# Patient Record
Sex: Female | Born: 1935 | Race: White | Smoking: Never smoker
Health system: Southern US, Community
[De-identification: ages and names within clinical notes are randomized; demographics above are authoritative.]

## PROBLEM LIST (undated history)

## (undated) DIAGNOSIS — H9190 Unspecified hearing loss, unspecified ear: Secondary | ICD-10-CM

## (undated) DIAGNOSIS — N183 Chronic kidney disease, stage 3 unspecified: Secondary | ICD-10-CM

## (undated) DIAGNOSIS — M5030 Other cervical disc degeneration, unspecified cervical region: Secondary | ICD-10-CM

## (undated) DIAGNOSIS — K219 Gastro-esophageal reflux disease without esophagitis: Secondary | ICD-10-CM

## (undated) DIAGNOSIS — R42 Dizziness and giddiness: Secondary | ICD-10-CM

## (undated) DIAGNOSIS — K912 Postsurgical malabsorption, not elsewhere classified: Secondary | ICD-10-CM

## (undated) DIAGNOSIS — R413 Other amnesia: Secondary | ICD-10-CM

## (undated) DIAGNOSIS — K90829 Short bowel syndrome, unspecified: Secondary | ICD-10-CM

## (undated) DIAGNOSIS — E039 Hypothyroidism, unspecified: Secondary | ICD-10-CM

## (undated) DIAGNOSIS — F32A Depression, unspecified: Secondary | ICD-10-CM

## (undated) DIAGNOSIS — H04123 Dry eye syndrome of bilateral lacrimal glands: Secondary | ICD-10-CM

## (undated) DIAGNOSIS — N281 Cyst of kidney, acquired: Secondary | ICD-10-CM

## (undated) DIAGNOSIS — F329 Major depressive disorder, single episode, unspecified: Secondary | ICD-10-CM

## (undated) DIAGNOSIS — M1990 Unspecified osteoarthritis, unspecified site: Secondary | ICD-10-CM

## (undated) DIAGNOSIS — F419 Anxiety disorder, unspecified: Secondary | ICD-10-CM

## (undated) DIAGNOSIS — S42309A Unspecified fracture of shaft of humerus, unspecified arm, initial encounter for closed fracture: Secondary | ICD-10-CM

## (undated) DIAGNOSIS — F0390 Unspecified dementia without behavioral disturbance: Secondary | ICD-10-CM

## (undated) DIAGNOSIS — Z9889 Other specified postprocedural states: Secondary | ICD-10-CM

## (undated) DIAGNOSIS — D6859 Other primary thrombophilia: Secondary | ICD-10-CM

## (undated) DIAGNOSIS — K5190 Ulcerative colitis, unspecified, without complications: Secondary | ICD-10-CM

## (undated) HISTORY — DX: Major depressive disorder, single episode, unspecified: F32.9

## (undated) HISTORY — DX: Unspecified dementia, unspecified severity, without behavioral disturbance, psychotic disturbance, mood disturbance, and anxiety: F03.90

## (undated) HISTORY — DX: Other primary thrombophilia: D68.59

## (undated) HISTORY — DX: Ulcerative colitis, unspecified, without complications: K51.90

## (undated) HISTORY — DX: Depression, unspecified: F32.A

## (undated) HISTORY — DX: Dry eye syndrome of bilateral lacrimal glands: H04.123

## (undated) HISTORY — DX: Other amnesia: R41.3

## (undated) HISTORY — DX: Postsurgical malabsorption, not elsewhere classified: K91.2

## (undated) HISTORY — PX: VAGINAL HYSTERECTOMY: SHX2639

## (undated) HISTORY — PX: APPENDECTOMY: SHX54

## (undated) HISTORY — DX: Other specified postprocedural states: Z98.890

## (undated) HISTORY — DX: Other cervical disc degeneration, unspecified cervical region: M50.30

## (undated) HISTORY — DX: Chronic kidney disease, stage 3 (moderate): N18.3

## (undated) HISTORY — DX: Short bowel syndrome, unspecified: K90.829

## (undated) HISTORY — DX: Gastro-esophageal reflux disease without esophagitis: K21.9

## (undated) HISTORY — DX: Cyst of kidney, acquired: N28.1

## (undated) HISTORY — DX: Anxiety disorder, unspecified: F41.9

## (undated) HISTORY — PX: OTHER SURGICAL HISTORY: SHX169

## (undated) HISTORY — DX: Dizziness and giddiness: R42

## (undated) HISTORY — DX: Hypothyroidism, unspecified: E03.9

## (undated) HISTORY — PX: CATARACT EXTRACTION: SUR2

## (undated) HISTORY — PX: ILEOSTOMY: SHX1783

## (undated) HISTORY — PX: COCHLEAR IMPLANT: SUR684

## (undated) HISTORY — DX: Unspecified fracture of shaft of humerus, unspecified arm, initial encounter for closed fracture: S42.309A

## (undated) HISTORY — DX: Chronic kidney disease, stage 3 unspecified: N18.30

## (undated) HISTORY — DX: Unspecified osteoarthritis, unspecified site: M19.90

## (undated) HISTORY — DX: Unspecified hearing loss, unspecified ear: H91.90

---

## 2010-12-11 ENCOUNTER — Other Ambulatory Visit: Payer: Self-pay | Admitting: Gastroenterology

## 2010-12-11 DIAGNOSIS — R109 Unspecified abdominal pain: Secondary | ICD-10-CM

## 2010-12-15 ENCOUNTER — Ambulatory Visit
Admission: RE | Admit: 2010-12-15 | Discharge: 2010-12-15 | Disposition: A | Discharge status: Home / Self Care | Payer: Medicare Other | Source: Ambulatory Visit | Attending: Gastroenterology | Admitting: Gastroenterology

## 2010-12-15 DIAGNOSIS — R109 Unspecified abdominal pain: Secondary | ICD-10-CM

## 2012-04-20 IMAGING — US US ABDOMEN COMPLETE
1 series · 14 of 25 positions shown · non-contrast
Comparison: None.

CLINICAL DATA: 3 months of pain.  Gallstones present on outside
CT.

COMPLETE ABDOMINAL ULTRASOUND

[Series 1: us abdomen complete · 0.19mm/px · 14 of 65 slices shown]
[im 1/65]
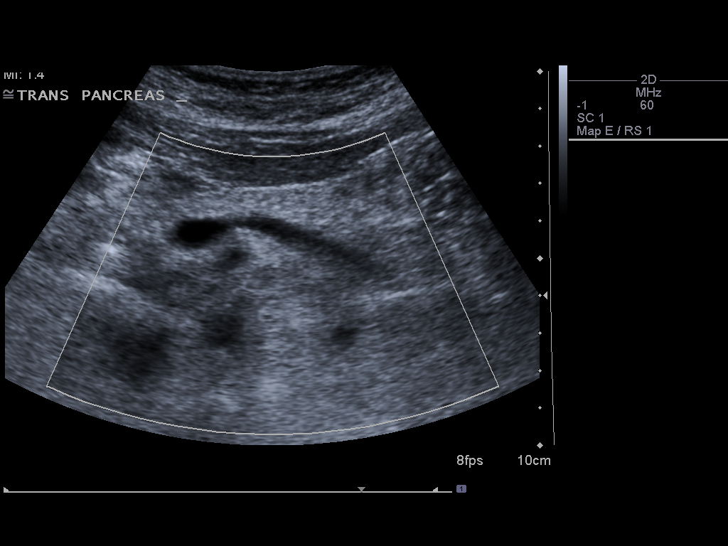
[im 6/65]
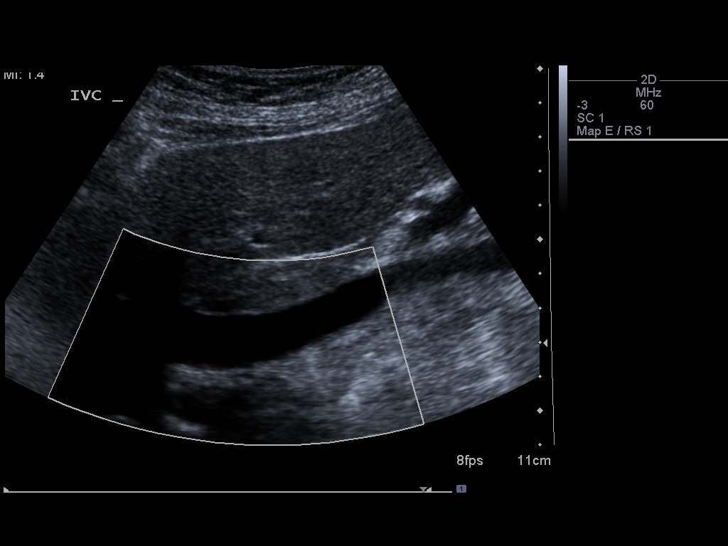
[im 11/65]
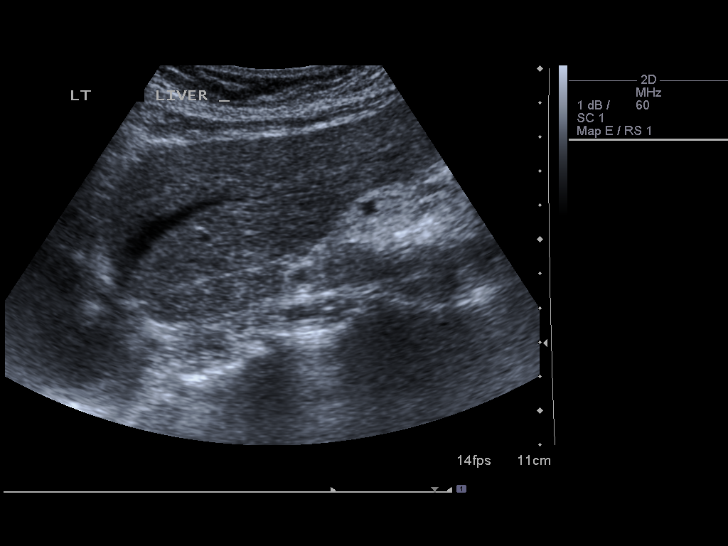
[im 17/65]
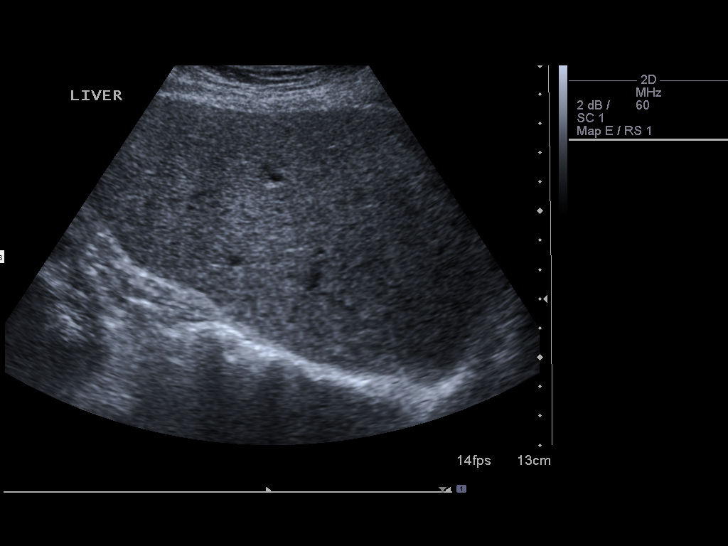
[im 22/65]
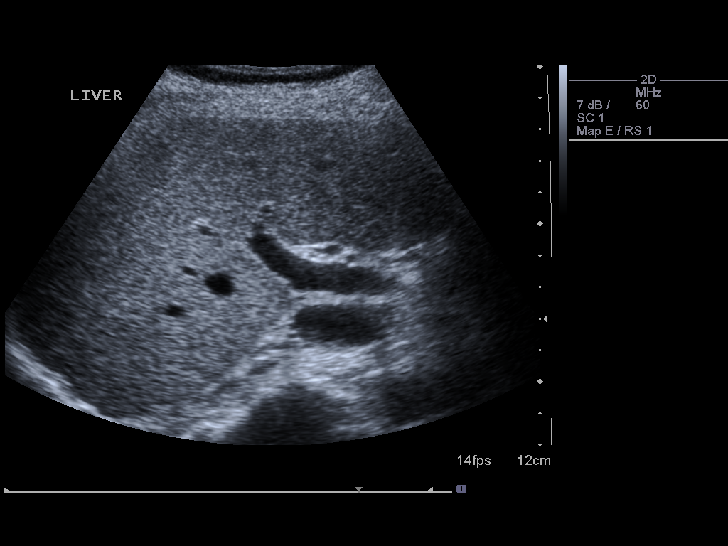
[im 25/65]
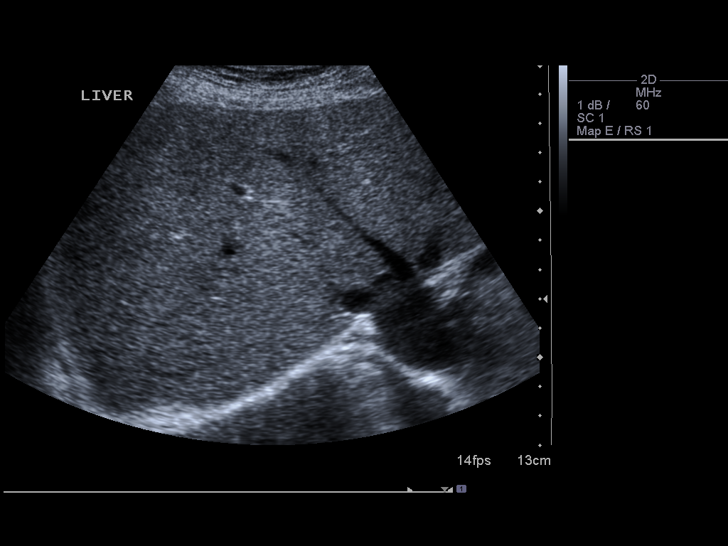
[im 30/65]
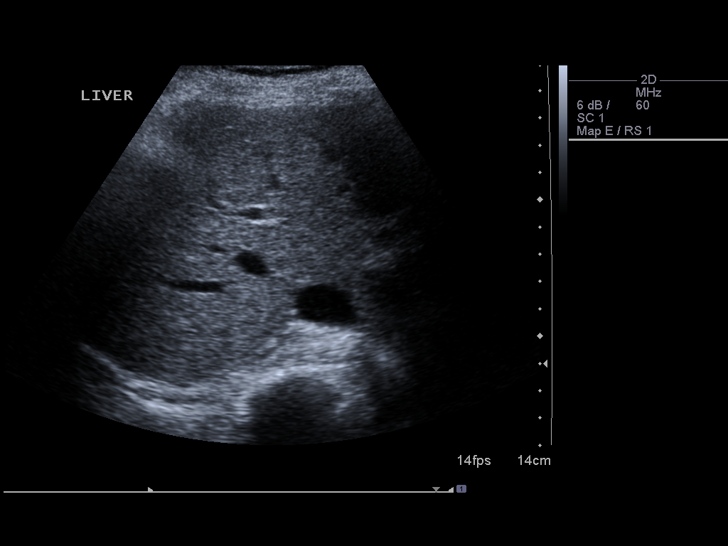
[im 35/65]
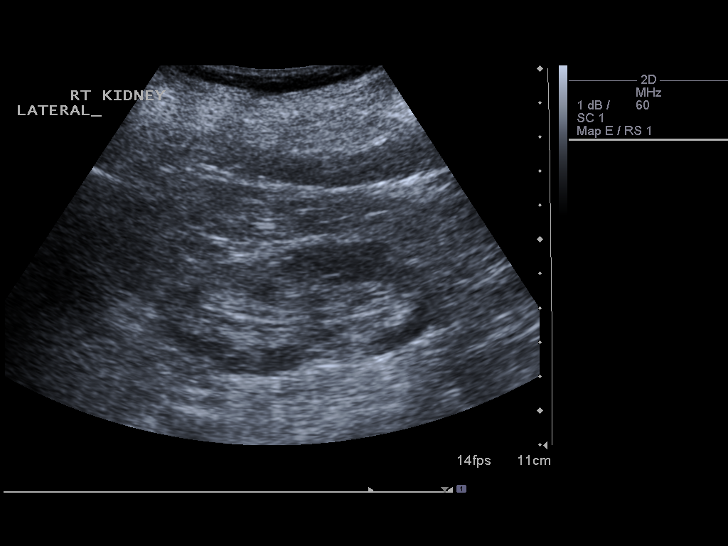
[im 41/65]
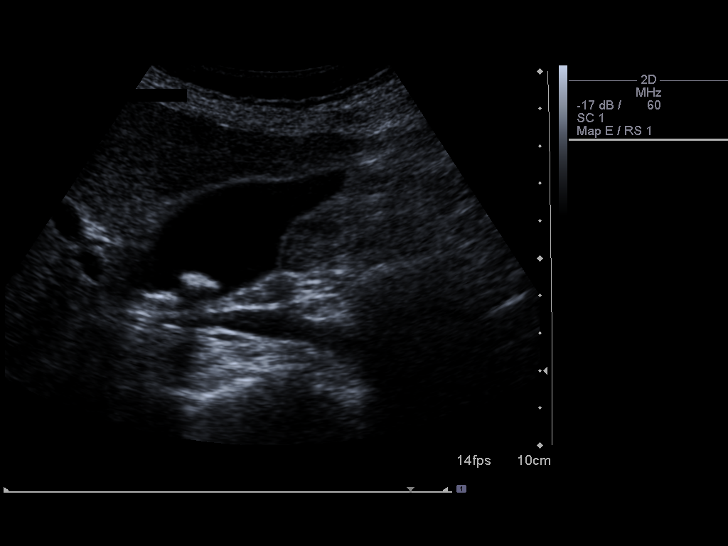
[im 43/65]
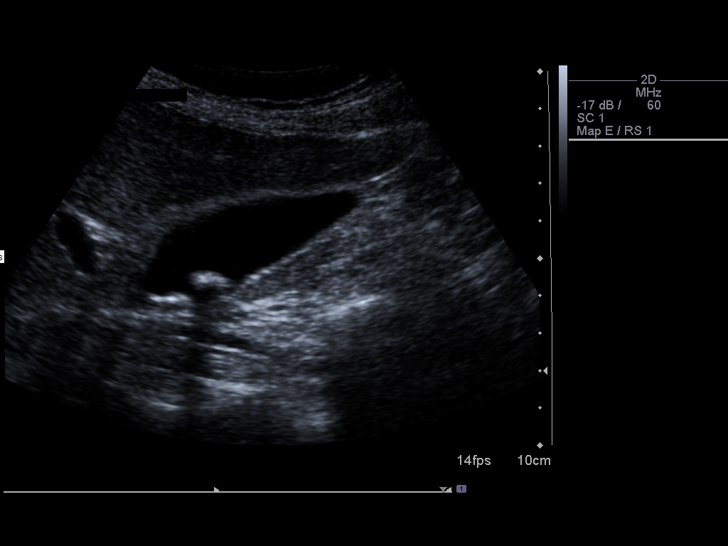
[im 49/65]
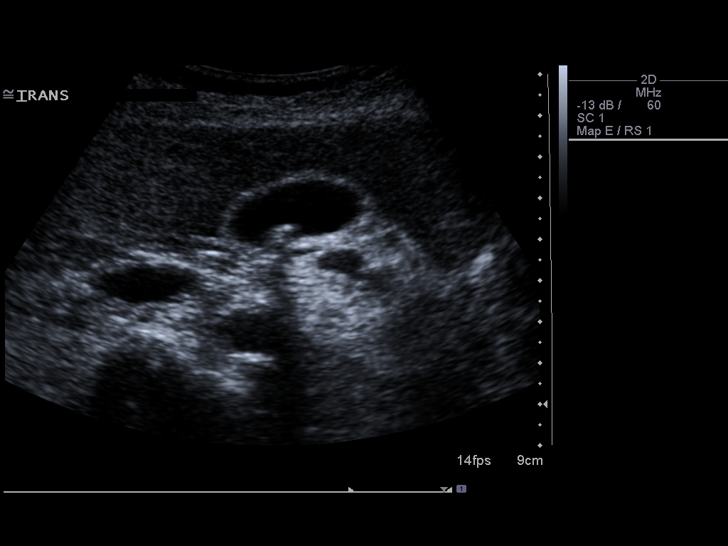
[im 54/65]
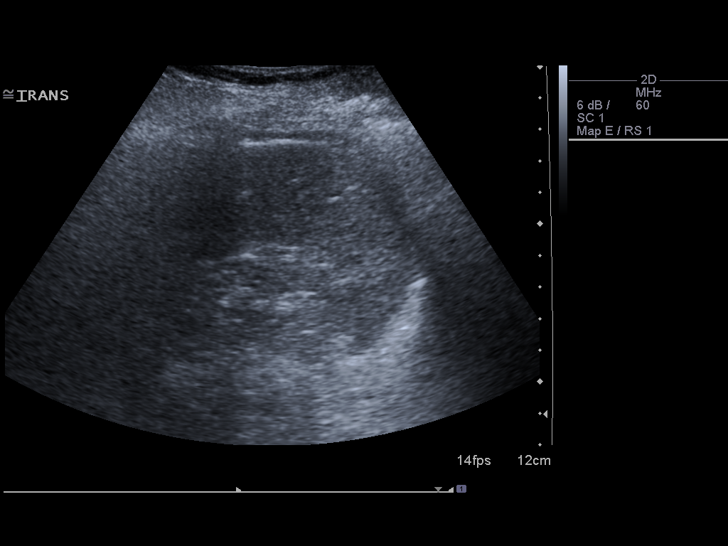
[im 59/65]
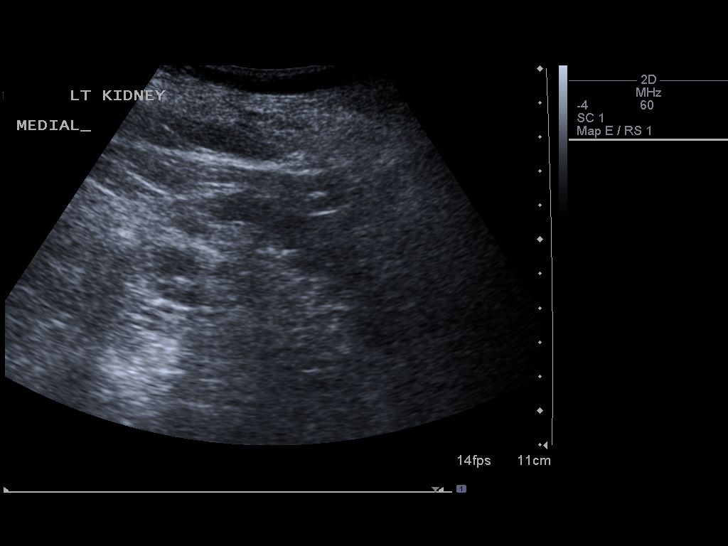
[im 65/65]
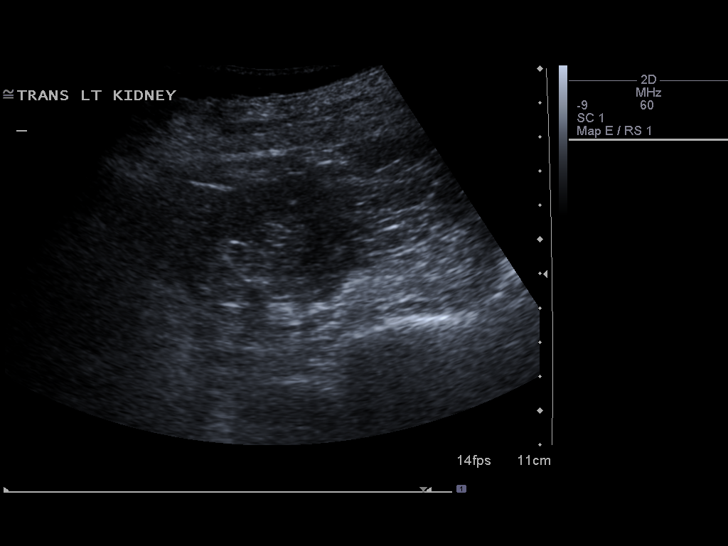

[14 of 25 positions shown; findings below may reference images not displayed]

FINDINGS: Gallbladder:  Mobile gallstones measure up to 1.2 cm.  No evidence
of gallbladder wall thickening or pericholecystic fluid.  The
patient was not tender over this region during scanning.

Common bile duct:  2.7 mm.

Liver:  Very mild increased echogenicity suggestive of mild fatty
infiltration without focal lesion noted.

IVC:  Appears normal.

Pancreas:  Slightly limited evaluation of the pancreatic tail
secondary to overlying bowel gas.  Otherwise no masses or
abnormality noted.

Spleen:  7.4 cm.  No focal mass.

Right Kidney:  8.7 cm in length. No hydronephrosis or renal mass.

Left Kidney:  9.9 cm in length.  No hydronephrosis.  Left lower
pole 3 cm cyst.

Abdominal aorta:  Atherosclerotic type changes without focal
aneurysm.
IMPRESSION: Mobile gallstones without evidence to suggest inflammation.

Very mild fatty infiltration of the liver suspected.

3 cm left lower pole renal cyst.

Atherosclerotic type changes of the aorta without aneurysmal
dilation.

## 2014-01-05 ENCOUNTER — Other Ambulatory Visit: Payer: Self-pay | Admitting: Nephrology

## 2014-01-08 ENCOUNTER — Other Ambulatory Visit: Payer: Self-pay | Admitting: Nephrology

## 2014-01-08 DIAGNOSIS — N183 Chronic kidney disease, stage 3 unspecified: Secondary | ICD-10-CM

## 2014-01-08 DIAGNOSIS — N281 Cyst of kidney, acquired: Secondary | ICD-10-CM

## 2014-01-11 ENCOUNTER — Other Ambulatory Visit: Payer: Medicare Other

## 2014-12-10 DIAGNOSIS — Z933 Colostomy status: Secondary | ICD-10-CM | POA: Diagnosis not present

## 2014-12-21 DIAGNOSIS — Z933 Colostomy status: Secondary | ICD-10-CM | POA: Diagnosis not present

## 2015-01-18 DIAGNOSIS — Z933 Colostomy status: Secondary | ICD-10-CM | POA: Diagnosis not present

## 2015-01-29 DIAGNOSIS — N189 Chronic kidney disease, unspecified: Secondary | ICD-10-CM | POA: Diagnosis not present

## 2015-01-29 DIAGNOSIS — R252 Cramp and spasm: Secondary | ICD-10-CM | POA: Diagnosis not present

## 2015-01-29 DIAGNOSIS — E039 Hypothyroidism, unspecified: Secondary | ICD-10-CM | POA: Diagnosis not present

## 2015-01-29 DIAGNOSIS — R69 Illness, unspecified: Secondary | ICD-10-CM | POA: Diagnosis not present

## 2015-01-29 DIAGNOSIS — Z79899 Other long term (current) drug therapy: Secondary | ICD-10-CM | POA: Diagnosis not present

## 2015-01-29 DIAGNOSIS — G4700 Insomnia, unspecified: Secondary | ICD-10-CM | POA: Diagnosis not present

## 2015-01-30 DIAGNOSIS — Z933 Colostomy status: Secondary | ICD-10-CM | POA: Diagnosis not present

## 2015-02-08 DIAGNOSIS — Z933 Colostomy status: Secondary | ICD-10-CM | POA: Diagnosis not present

## 2015-02-18 DIAGNOSIS — Z933 Colostomy status: Secondary | ICD-10-CM | POA: Diagnosis not present

## 2015-02-20 DIAGNOSIS — N3090 Cystitis, unspecified without hematuria: Secondary | ICD-10-CM | POA: Diagnosis not present

## 2015-02-20 DIAGNOSIS — R300 Dysuria: Secondary | ICD-10-CM | POA: Diagnosis not present

## 2015-02-27 DIAGNOSIS — Z933 Colostomy status: Secondary | ICD-10-CM | POA: Diagnosis not present

## 2015-03-12 DIAGNOSIS — N189 Chronic kidney disease, unspecified: Secondary | ICD-10-CM | POA: Diagnosis not present

## 2015-03-12 DIAGNOSIS — Z933 Colostomy status: Secondary | ICD-10-CM | POA: Diagnosis not present

## 2015-03-12 DIAGNOSIS — D631 Anemia in chronic kidney disease: Secondary | ICD-10-CM | POA: Diagnosis not present

## 2015-03-12 DIAGNOSIS — N2581 Secondary hyperparathyroidism of renal origin: Secondary | ICD-10-CM | POA: Diagnosis not present

## 2015-03-12 DIAGNOSIS — N183 Chronic kidney disease, stage 3 (moderate): Secondary | ICD-10-CM | POA: Diagnosis not present

## 2015-03-13 DIAGNOSIS — R51 Headache: Secondary | ICD-10-CM | POA: Diagnosis not present

## 2015-03-13 DIAGNOSIS — R2689 Other abnormalities of gait and mobility: Secondary | ICD-10-CM | POA: Diagnosis not present

## 2015-03-13 DIAGNOSIS — N189 Chronic kidney disease, unspecified: Secondary | ICD-10-CM | POA: Diagnosis not present

## 2015-03-13 DIAGNOSIS — R413 Other amnesia: Secondary | ICD-10-CM | POA: Diagnosis not present

## 2015-03-13 DIAGNOSIS — R2681 Unsteadiness on feet: Secondary | ICD-10-CM | POA: Diagnosis not present

## 2015-03-13 DIAGNOSIS — R630 Anorexia: Secondary | ICD-10-CM | POA: Diagnosis not present

## 2015-03-13 DIAGNOSIS — G4700 Insomnia, unspecified: Secondary | ICD-10-CM | POA: Diagnosis not present

## 2015-03-13 DIAGNOSIS — N390 Urinary tract infection, site not specified: Secondary | ICD-10-CM | POA: Diagnosis not present

## 2015-03-13 DIAGNOSIS — E039 Hypothyroidism, unspecified: Secondary | ICD-10-CM | POA: Diagnosis not present

## 2015-03-15 DIAGNOSIS — H04123 Dry eye syndrome of bilateral lacrimal glands: Secondary | ICD-10-CM | POA: Diagnosis not present

## 2015-03-19 DIAGNOSIS — R7989 Other specified abnormal findings of blood chemistry: Secondary | ICD-10-CM | POA: Diagnosis not present

## 2015-03-26 ENCOUNTER — Ambulatory Visit (INDEPENDENT_AMBULATORY_CARE_PROVIDER_SITE_OTHER): Payer: Medicare HMO | Admitting: Neurology

## 2015-03-26 ENCOUNTER — Encounter: Payer: Self-pay | Admitting: Neurology

## 2015-03-26 VITALS — BP 157/70 | HR 83 | Ht 64.0 in | Wt 125.0 lb

## 2015-03-26 DIAGNOSIS — R269 Unspecified abnormalities of gait and mobility: Secondary | ICD-10-CM

## 2015-03-26 MED ORDER — MEMANTINE HCL 10 MG PO TABS: 10.0000 mg | ORAL_TABLET | Freq: Two times a day (BID) | ORAL | Status: DC

## 2015-03-26 NOTE — Progress Notes (Addendum)
PATIENT: Bonnie Butler DOB: [birth year]  Chief Complaint  Patient presents with  . Memory Loss    MMSE 27/30 - 12 animals.  She is here with her daughter, [name]. They have concerns about her worsening memory.  She is currently using Exelon 9.5mg  patches.  . Dizziness    Orthostatics: Lying 135/75, 76, Sitting 130/70, 80, Standing 117/65, 83. She often feels dizzy and unsteady.  She has had several falls due to her symptoms.  She recently hit her head due to a fall that occurred when she got out of bed, during the night, to use the restroom.     HISTORICAL  Bonnie Butler is a 81 years old right-handed female, accompanied by her daughter [name], seen in refer by her primary care physician Dr. [name] in March 26 2015 for evaluation of memory loss, unsteady gait tendency to lean backwards.  She had a history of ulcerative colitis, had a history of ileostomy since 1978, she also had a history of progressive hearing loss had right cochlear implantation, still has poor hearing, wearing left hearing aid, long-term Coumadin for positive lupus anticoagulants, vitamin B12 deficiency, on supplement, hypothyroidism, on supplement,  I have reviewed and summarized her previous neurological record from Dr. 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 She has been treated with Exelon patch 9.6 milligrams daily  She was noted to have memory trouble tends to repeat herself, no longer cooking regularly,  difficulty to keep up with her grandchildren's birthday, she was noted to have gradual onset gait difficulty over the past few years, hold on furniture to walk, tendency to fell backwards, dizziness spinning sensation sometimes  We have reviewed her CAT scan in 2017, there was right cochlear implant, generalized atrophy, especially superior cerebellum atrophy no acute lesions.  REVIEW OF SYSTEMS: Full 14 system review of systems performed and notable only for fatigue, hearing loss, ringing ears, spinning sensation, trouble swallowing, loss of vision, shortness of breath, diarrhea, feeling cold, joint pain, cramps, memory loss, confusion, weakness, dizziness, insomnia, sleepiness, restless leg, depression, anxiety, not enough sleep, decreased energy, change in appetite, disinteresting activities, running nose  ALLERGIES: Allergies  Allergen Reactions  . Penicillins Rash  . Sulfa Antibiotics Rash    Other Reaction: Other reaction  . Macrobid [Nitrofurantoin] Rash  . Rocephin [Ceftriaxone Sodium In Dextrose] Rash  . Tape Rash    HOME MEDICATIONS: Current Outpatient Prescriptions  Medication Sig Dispense Refill  . ALPRAZolam (XANAX) 0.5 MG tablet Take 0.5 mg by mouth 2 (two) times daily as needed.    . cyanocobalamin (,VITAMIN B-12,) 1000 MCG/ML injection Inject into the muscle every 30 (thirty) days.    . diphenoxylate-atropine (LOMOTIL) 2.5-0.025 MG tablet Take by mouth as needed.    Marland Kitchen escitalopram (LEXAPRO) 20 MG tablet Take 20 mg by mouth daily.  5  . hypromellose (GENTEAL SEVERE) 0.3 % GEL ophthalmic ointment Place into both eyes as needed.    Marland Kitchen levothyroxine (SYNTHROID, LEVOTHROID) 50 MCG tablet Take 50 mcg by mouth daily.  11  . loperamide (IMODIUM) 2 MG capsule Take 4 mg by mouth as needed.    . meclizine (ANTIVERT) 12.5 MG tablet Take 12.5 mg by mouth 3 (three) times daily as needed for dizziness. Take 1-2 tabs TID PRN.    . mirtazapine (REMERON) 15 MG tablet nightly.    Marland Kitchen  omeprazole (PRILOSEC) 40 MG capsule Take 40 mg by mouth daily.    . ondansetron (ZOFRAN) 4 MG tablet Take 4 mg by mouth as needed.    . rivastigmine (EXELON) 9.5 mg/24hr daily.    . traMADol (ULTRAM) 50 MG tablet Take 50 mg by mouth every 6 (six) hours as needed. for pain  5   No current facility-administered medications for this visit.    PAST MEDICAL HISTORY: Past Medical History  Diagnosis Date  . Osteoarthritis   . Depression   . Memory loss   . Ulcerative colitis ([place])   . Hearing loss   . GERD (gastroesophageal reflux disease)   . Anxiety   . Dry eyes   . Chronic renal disease, stage 3, moderately decreased glomerular filtration rate between 30-59 mL/min/1.73 square meter   . Hypothyroidism   . Vertigo   . Renal cyst   . DDD (degenerative disc disease), cervical   . Short gut syndrome   . Broken upper arm     right  . Low protein C activity level (HCC)   . H/O ileostomy ([place])   . Dementia     PAST SURGICAL HISTORY: Past Surgical History  Procedure Laterality Date  . Appendectomy    . Vaginal hysterectomy    . Colectomy    . Cochlear implant      right  . Cataract extraction      bilateral  . Ileostomy      FAMILY HISTORY: Family History  Problem Relation Age of Onset  . Congestive Heart Failure Mother   . Kidney disease Father   . Lung cancer Brother   . Stroke Sister   . COPD Sister     SOCIAL HISTORY:  Social History   Social History  . Marital Status: Married    Spouse Name: N/A  . Number of Children: 3  . Years of Education: 12   Occupational History  . Retired    Social History Main Topics  . Smoking status: Never Smoker   . Smokeless tobacco: Not on file  . Alcohol Use: No  . Drug Use: No  . Sexual Activity: Not on file   Other Topics Concern  . Not on file   Social History Narrative   Lives at home alone.   Right-handed.   Occasional caffeine use.     PHYSICAL EXAM   Filed Vitals:   03/26/15 1039  BP: 157/70    Pulse: 83  Height: 5\' 4"  (1.626 m)  Weight: 125 lb (56.7 kg)    Not recorded      Body mass index is 21.45 kg/(m^2).  PHYSICAL EXAMNIATION:  Gen: NAD, conversant, well nourised, obese, well groomed                     Cardiovascular: Regular rate rhythm, no peripheral edema, warm, nontender. Eyes: Conjunctivae clear without exudates or hemorrhage Neck: Supple, no carotid bruise. Pulmonary: Clear to auscultation bilaterally   NEUROLOGICAL EXAM:  MENTAL STATUS: Speech:    Mildly slurred speech, hard of hearing.  Cognition: Mini-Mental Status Examination is 27 out of 30, animal naming was 12  Orientation to time, place and person     Recent and remote memory: She missed 3 out of 3 recalls     Normal Attention span and concentration     Normal Language, naming, repeating,spontaneous speech     Fund of knowledge   CRANIAL NERVES: CN II: Visual fields are full to confrontation. Fundoscopic exam is normal with sharp discs and no vascular changes. Pupils are round equal and briskly reactive to light. CN III, IV, VI: extraocular movement are normal. No ptosis. CN V: Facial sensation is intact to pinprick in all 3 divisions bilaterally. Corneal responses are intact.  CN VII: Face is symmetric with normal eye closure and smile. CN VIII: She has hard of hearing CN IX, X: Palate elevates symmetrically. Phonation is normal. CN XI: Head turning and shoulder shrug are intact CN XII: Tongue is midline with normal movements and no atrophy.  MOTOR: There is no pronator drift of out-stretched arms. Muscle bulk and tone are normal. Muscle strength is normal.  REFLEXES: Reflexes are 3+ and symmetric at the biceps, triceps, knees, and ankles. Plantar responses are flexor.  SENSORY: Intact to light touch, pinprick, position sense, and vibration sense are intact in fingers and toes.  COORDINATION: Rapid alternating movements and fine finger movements are intact. There is no dysmetria on  finger-to-nose and heel-knee-shin. Mild trunk ataxia   GAIT/STANCE: She has a tendency to fell backwards, wide-based, unsteady, mild trunk ataxia, COULD not perform tandem walking   DIAGNOSTIC DATA (LABS, IMAGING, TESTING) - I reviewed patient records, labs, notes, testing and imaging myself where available.   ASSESSMENT AND PLAN  SHEBA SEFTON is a 81 y.o. female    Unsteady gait  Multifactorial, this includes cerebellar degeneration, possible inner ear/ vestibular system malfunction consistent with her sensory neuronal hearing loss,   Need to rule out cervical myelopathy with her hyperreflexia   proceed with CAT scan of cervical spine, not a candidate for MRI due to right cochlear implant  Mild cognitive impairment  Overall stable  No treatable etiology found, her depression anxiety polypharmacy likely contributed to her complains of memory loss as well  Keep Exelon patch  Add on Namenda 10 mg twice a day  Marcial Pacas, M.D. Ph.D.  [facility] [address], [place], [place] [address] Ph: [phone] Fax: [phone]  CC: [name], MD  Addendum: I reviewed cervical spine without contrast report in April 01 2015 from [facility] radiology at [facility], there is broad-based disc osteophyte complex and uncovertebral spurring at C6-7 with moderate foraminal stenosis, left greater than right, moderate foraminal stenosis is also present at T1-2, worse on the left, shallow disc protrusion at C2-3, C3-4 without significant stenosis, mild left foraminal narrowing at C4-5, asymmetric left-sided facet hypertrophy at C4-5, C5-6, disease progressed since 2011 including C6 and 7

## 2015-03-27 DIAGNOSIS — Z933 Colostomy status: Secondary | ICD-10-CM | POA: Diagnosis not present

## 2015-04-01 DIAGNOSIS — M5021 Other cervical disc displacement,  high cervical region: Secondary | ICD-10-CM | POA: Diagnosis not present

## 2015-04-01 DIAGNOSIS — R269 Unspecified abnormalities of gait and mobility: Secondary | ICD-10-CM | POA: Diagnosis not present

## 2015-04-01 DIAGNOSIS — M4802 Spinal stenosis, cervical region: Secondary | ICD-10-CM | POA: Diagnosis not present

## 2015-04-04 ENCOUNTER — Telehealth: Payer: Self-pay | Admitting: Neurology

## 2015-04-04 NOTE — Telephone Encounter (Signed)
Patient's daughter is calling to get CT results for the patient.

## 2015-04-04 NOTE — Telephone Encounter (Signed)
Spoke to [employer] (dgt on [employer]) - she is aware of results and will bring CD to the follow up appt.

## 2015-04-04 NOTE — Telephone Encounter (Signed)
Selinda Eon, please call, CAT scan of the cervical spine showed evidence of multilevel degenerative changes, with evidence of foraminal stenosis at different levels, but there was no evidence of significant canal stenosis, that would likely explain her gait difficulty, she should bring CAT scan on CD for her next visit in March, we can review the imaging together.    Addendum: I reviewed cervical spine without contrast report in April 01 2015 from [facility] radiology at [facility], there is broad-based disc osteophyte complex and uncovertebral spurring at C6-7 with moderate foraminal stenosis, left greater than right, moderate foraminal stenosis is also present at T1-2, worse on the left, shallow disc protrusion at C2-3, C3-4 without significant stenosis, mild left foraminal narrowing at C4-5, asymmetric left-sided facet hypertrophy at C4-5, C5-6, disease progressed since 2011 including C6 and 7

## 2015-04-04 NOTE — Telephone Encounter (Signed)
She had her CT scan on 04/01/15 at [facility].

## 2015-04-08 DIAGNOSIS — Z933 Colostomy status: Secondary | ICD-10-CM | POA: Diagnosis not present

## 2015-04-10 ENCOUNTER — Ambulatory Visit (INDEPENDENT_AMBULATORY_CARE_PROVIDER_SITE_OTHER): Payer: Medicare HMO | Admitting: Neurology

## 2015-04-10 ENCOUNTER — Encounter: Payer: Self-pay | Admitting: Neurology

## 2015-04-10 VITALS — BP 139/69 | HR 73 | Ht 64.0 in | Wt 129.0 lb

## 2015-04-10 DIAGNOSIS — N183 Chronic kidney disease, stage 3 (moderate): Secondary | ICD-10-CM | POA: Diagnosis not present

## 2015-04-10 DIAGNOSIS — R269 Unspecified abnormalities of gait and mobility: Secondary | ICD-10-CM

## 2015-04-10 DIAGNOSIS — G3184 Mild cognitive impairment, so stated: Secondary | ICD-10-CM | POA: Diagnosis not present

## 2015-04-10 NOTE — Progress Notes (Signed)
Chief Complaint  Patient presents with  . Abnormality of gait    She is here with her daughter, [name].  They would like to review her CT scan (she brought the disc with her).    . Memory Loss    She actually misread the directions on her Namenda prescription and has only been taking it once daily.  No change in memory.  She will increase it to the correct dosage.      PATIENT: Bonnie Butler DOB: [birth year]  Chief Complaint  Patient presents with  . Abnormality of gait    She is here with her daughter, [name].  They would like to review her CT scan (she brought the disc with her).    . Memory Loss    She actually misread the directions on her Namenda prescription and has only been taking it once daily.  No change in memory.  She will increase it to the correct dosage.     HISTORICAL  Bonnie Butler is a 81 years old right-handed female, accompanied by her daughter [name], seen in refer by her primary care physician Dr. [name] in March 26 2015 for evaluation of memory loss, unsteady gait tendency to lean backwards.  She had a history of ulcerative colitis, had a history of ileostomy since 1978, she also had a history of progressive hearing loss had right cochlear implantation, still has poor hearing, wearing left hearing aid, long-term Coumadin for positive lupus anticoagulants, vitamin B12 deficiency, on supplement, hypothyroidism, on supplement,  I have reviewed and summarized her previous neurological record from Dr. 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 She has been treated with Exelon patch 9.6 milligrams daily  She was noted to have memory trouble tends to repeat herself, no longer cooking regularly, difficulty to keep up with her grandchildren's birthday, she was noted to have gradual onset gait difficulty over the past few years, hold on furniture to walk, tendency to fell backwards, dizziness spinning sensation sometimes  We have reviewed her CAT scan in 2017, there was right cochlear implant, generalized atrophy, especially superior cerebellum atrophy no acute lesions.  Update April 10 2015: Overall, her gait has much improved, she only take meclizine as needed for dizziness, complains of drowsiness with medications,  We have reviewed the film of CAT scan of cervical spine in April 01 2015 from [facility] radiology at [facility], there is broad-based disc osteophyte complex and uncovertebral spurring at C6-7 with moderate foraminal stenosis, left greater than right, moderate foraminal stenosis is also present at T1-2, worse on the left, shallow disc protrusion at C2-3, C3-4 without significant stenosis, mild left foraminal narrowing at C4-5, asymmetric left-sided facet hypertrophy at C4-5, C5-6, disease progressed since 2011 including C6 and 7  She was taking Namenda 10 mg daily instead of twice a day, tolerating medication well, daughter reported slow worsening memory trouble, making mistakes on her bill, more confused about her medications REVIEW OF SYSTEMS: Full 14 system review of systems performed and notable only for as  above  ALLERGIES: Allergies  Allergen Reactions  . Penicillins Rash  . Sulfa Antibiotics Rash    Other Reaction: Other reaction  . Macrobid [Nitrofurantoin] Rash  . Rocephin [Ceftriaxone Sodium In Dextrose] Rash  . Tape Rash    HOME MEDICATIONS: Current Outpatient Prescriptions  Medication Sig Dispense Refill  .  ALPRAZolam (XANAX) 0.5 MG tablet Take 0.5 mg by mouth 2 (two) times daily as needed.    . cyanocobalamin (,VITAMIN B-12,) 1000 MCG/ML injection Inject into the muscle every 30 (thirty) days.    . diphenoxylate-atropine (LOMOTIL) 2.5-0.025 MG tablet Take by mouth as needed.    Marland Kitchen escitalopram (LEXAPRO) 20 MG tablet Take 20 mg by mouth daily.  5  . hypromellose (GENTEAL SEVERE) 0.3 % GEL ophthalmic ointment Place into both eyes as needed.    Marland Kitchen levothyroxine (SYNTHROID, LEVOTHROID) 50 MCG tablet Take 50 mcg by mouth daily.  11  . loperamide (IMODIUM) 2 MG capsule Take 4 mg by mouth as needed.    . meclizine (ANTIVERT) 12.5 MG tablet Take 12.5 mg by mouth 3 (three) times daily as needed for dizziness. Take 1-2 tabs TID PRN.    . memantine (NAMENDA) 10 MG tablet Take 1 tablet (10 mg total) by mouth 2 (two) times daily. 60 tablet 11  . mirtazapine (REMERON) 15 MG tablet nightly.    Marland Kitchen omeprazole (PRILOSEC) 40 MG capsule Take 40 mg by mouth daily.    . ondansetron (ZOFRAN) 4 MG tablet Take 4 mg by mouth as needed.    . rivastigmine (EXELON) 9.5 mg/24hr daily.    . traMADol (ULTRAM) 50 MG tablet Take 50 mg by mouth every 6 (six) hours as needed. for pain  5   No current facility-administered medications for this visit.    PAST MEDICAL HISTORY: Past Medical History  Diagnosis Date  . Osteoarthritis   . Depression   . Memory loss   . Ulcerative colitis ([place])   . Hearing loss   . GERD (gastroesophageal reflux disease)   . Anxiety   . Dry eyes   . Chronic renal disease, stage 3, moderately decreased glomerular filtration rate between 30-59 mL/min/1.73 square meter   . Hypothyroidism   . Vertigo   . Renal cyst   . DDD (degenerative disc disease), cervical   . Short gut syndrome   . Broken upper arm     right  . Low protein C activity level (HCC)   . H/O ileostomy ([place])   . Dementia     PAST SURGICAL HISTORY: Past Surgical History  Procedure Laterality Date  . Appendectomy    . Vaginal  hysterectomy    . Colectomy    . Cochlear implant      right  . Cataract extraction      bilateral  . Ileostomy      FAMILY HISTORY: Family History  Problem Relation Age of Onset  . Congestive Heart Failure Mother   . Kidney disease Father   . Lung cancer Brother   . Stroke Sister   . COPD Sister     SOCIAL HISTORY:  Social History   Social History  . Marital Status: Married    Spouse Name: N/A  . Number of Children: 3  . Years of Education: 12   Occupational History  . Retired    Social History Main Topics  . Smoking status: Never Smoker   . Smokeless tobacco: Not on file  . Alcohol Use: No  . Drug Use: No  . Sexual  Activity: Not on file   Other Topics Concern  . Not on file   Social History Narrative   Lives at home alone.   Right-handed.   Occasional caffeine use.     PHYSICAL EXAM   Filed Vitals:   04/10/15 1137  BP: 139/69  Pulse: 73  Height: 5\' 4"  (1.626 m)  Weight: 129 lb (58.514 kg)    Not recorded      Body mass index is 22.13 kg/(m^2).  PHYSICAL EXAMNIATION:  Gen: NAD, conversant, well nourised, obese, well groomed                     Cardiovascular: Regular rate rhythm, no peripheral edema, warm, nontender. Eyes: Conjunctivae clear without exudates or hemorrhage Neck: Supple, no carotid bruise. Pulmonary: Clear to auscultation bilaterally   NEUROLOGICAL EXAM:  MENTAL STATUS: Speech:    Mildly slurred speech, hard of hearing.  Cognition: Mini-Mental Status Examination is 27 out of 30, animal naming was 12     Orientation to time, place and person     Recent and remote memory: She missed 3 out of 3 recalls     Normal Attention span and concentration     Normal Language, naming, repeating,spontaneous speech     Fund of knowledge   CRANIAL NERVES: CN II: Visual fields are full to confrontation. Fundoscopic exam is normal with sharp discs and no vascular changes. Pupils are round equal and briskly reactive to light. CN III,  IV, VI: extraocular movement are normal. No ptosis. CN V: Facial sensation is intact to pinprick in all 3 divisions bilaterally. Corneal responses are intact.  CN VII: Face is symmetric with normal eye closure and smile. CN VIII: She has hard of hearing CN IX, X: Palate elevates symmetrically. Phonation is normal. CN XI: Head turning and shoulder shrug are intact CN XII: Tongue is midline with normal movements and no atrophy.  MOTOR: There is no pronator drift of out-stretched arms. Muscle bulk and tone are normal. Muscle strength is normal.  REFLEXES: Reflexes are 3+ and symmetric at the biceps, triceps, knees, and ankles. Plantar responses are flexor.  SENSORY: Intact to light touch, pinprick, position sense, and vibration sense are intact in fingers and toes.  COORDINATION: Rapid alternating movements and fine finger movements are intact. There is no dysmetria on finger-to-nose and heel-knee-shin. Mild trunk ataxia   GAIT/STANCE: She needs to push up to get up from seated position, steady,, no significant trunk ataxia, COULD not perform tandem walking  Romberg signs was negative   DIAGNOSTIC DATA (LABS, IMAGING, TESTING) - I reviewed patient records, labs, notes, testing and imaging myself where available.   ASSESSMENT AND PLAN  KRISTOL CASIDA is a 81 y.o. female    Unsteady gait  Multifactorial, this includes cerebellar degeneration, possible inner ear/ vestibular system malfunction consistent with her sensory neuronal hearing loss,   CAT scan of cervical spine showed multilevel degenerative changes, especially at C6-7, no evidence of cervical myelopathy   Mild cognitive impairment  Referring her to neuropsychiatric evaluation  No treatable etiology found, her depression anxiety polypharmacy likely contributed to her complains of memory loss as well  Keep Exelon patch, Namenda 10 mg twice a day  Marcial Pacas, M.D. Ph.D.  [facility] [address],  [place],  [address] Ph: [phone] Fax: [phone]  CC: [name], MD

## 2015-04-17 DIAGNOSIS — Z933 Colostomy status: Secondary | ICD-10-CM | POA: Diagnosis not present

## 2015-04-30 DIAGNOSIS — Z933 Colostomy status: Secondary | ICD-10-CM | POA: Diagnosis not present

## 2015-05-01 ENCOUNTER — Ambulatory Visit: Payer: Medicare HMO | Admitting: Neurology

## 2015-05-16 DIAGNOSIS — Z933 Colostomy status: Secondary | ICD-10-CM | POA: Diagnosis not present

## 2015-05-29 DIAGNOSIS — E538 Deficiency of other specified B group vitamins: Secondary | ICD-10-CM | POA: Diagnosis not present

## 2015-05-29 DIAGNOSIS — R5383 Other fatigue: Secondary | ICD-10-CM | POA: Diagnosis not present

## 2015-05-29 DIAGNOSIS — N183 Chronic kidney disease, stage 3 (moderate): Secondary | ICD-10-CM | POA: Diagnosis not present

## 2015-05-29 DIAGNOSIS — D649 Anemia, unspecified: Secondary | ICD-10-CM | POA: Diagnosis not present

## 2015-05-29 DIAGNOSIS — Z79899 Other long term (current) drug therapy: Secondary | ICD-10-CM | POA: Diagnosis not present

## 2015-05-29 DIAGNOSIS — E039 Hypothyroidism, unspecified: Secondary | ICD-10-CM | POA: Diagnosis not present

## 2015-05-30 DIAGNOSIS — Z933 Colostomy status: Secondary | ICD-10-CM | POA: Diagnosis not present

## 2015-06-05 DIAGNOSIS — Z933 Colostomy status: Secondary | ICD-10-CM | POA: Diagnosis not present

## 2015-06-06 DIAGNOSIS — B372 Candidiasis of skin and nail: Secondary | ICD-10-CM | POA: Diagnosis not present

## 2015-06-17 DIAGNOSIS — Z933 Colostomy status: Secondary | ICD-10-CM | POA: Diagnosis not present

## 2015-06-26 DIAGNOSIS — H524 Presbyopia: Secondary | ICD-10-CM | POA: Diagnosis not present

## 2015-06-26 DIAGNOSIS — Z961 Presence of intraocular lens: Secondary | ICD-10-CM | POA: Diagnosis not present

## 2015-06-26 DIAGNOSIS — H04123 Dry eye syndrome of bilateral lacrimal glands: Secondary | ICD-10-CM | POA: Diagnosis not present

## 2015-06-27 DIAGNOSIS — D519 Vitamin B12 deficiency anemia, unspecified: Secondary | ICD-10-CM | POA: Diagnosis not present

## 2015-06-28 DIAGNOSIS — Z933 Colostomy status: Secondary | ICD-10-CM | POA: Diagnosis not present

## 2015-07-01 DIAGNOSIS — E538 Deficiency of other specified B group vitamins: Secondary | ICD-10-CM | POA: Diagnosis not present

## 2015-07-09 DIAGNOSIS — Z933 Colostomy status: Secondary | ICD-10-CM | POA: Diagnosis not present

## 2015-07-15 ENCOUNTER — Ambulatory Visit: Payer: Medicare Other | Admitting: Psychology

## 2015-07-17 DIAGNOSIS — E538 Deficiency of other specified B group vitamins: Secondary | ICD-10-CM | POA: Diagnosis not present

## 2015-07-18 DIAGNOSIS — Z933 Colostomy status: Secondary | ICD-10-CM | POA: Diagnosis not present

## 2015-07-25 DIAGNOSIS — Z933 Colostomy status: Secondary | ICD-10-CM | POA: Diagnosis not present

## 2015-08-02 DIAGNOSIS — E538 Deficiency of other specified B group vitamins: Secondary | ICD-10-CM | POA: Diagnosis not present

## 2015-08-07 DIAGNOSIS — Z933 Colostomy status: Secondary | ICD-10-CM | POA: Diagnosis not present

## 2015-08-14 DIAGNOSIS — Z933 Colostomy status: Secondary | ICD-10-CM | POA: Diagnosis not present

## 2015-08-29 DIAGNOSIS — Z933 Colostomy status: Secondary | ICD-10-CM | POA: Diagnosis not present

## 2015-09-09 DIAGNOSIS — Z933 Colostomy status: Secondary | ICD-10-CM | POA: Diagnosis not present

## 2015-09-12 ENCOUNTER — Ambulatory Visit: Payer: Medicare HMO | Attending: Psychology | Admitting: Psychology

## 2015-09-12 DIAGNOSIS — G3184 Mild cognitive impairment, so stated: Secondary | ICD-10-CM | POA: Diagnosis not present

## 2015-09-12 NOTE — Progress Notes (Signed)
[facility]  [address]   Telephone [phone] [address] Fax [phone] [place], [place] [address]  Initial Contact Note  Name:  Bonnie Butler Date of Birth; June 08, 1934 MRN:  [id number] Date:  09/12/2015  FELICA COWARD is an 81 y.o. female who was referred for neuropsychological evaluation by Marcial Pacas, MD due to a several year history of progressive memory loss.    A total of 5 hours was spent today reviewing medical records, interviewing (CPT [phone]) Nino Glow, her daughter, son and daughter-in-law, administering and scoring neurocognitive tests, and preparing a written report  (CPT [phone] & [phone]).  Diagnostic Impression: Mild Cognitive Impairment [G31.84]  There were no concerns expressed or behaviors displayed by Nino Glow that would require immediate attention.   A full report will follow once the she and family have been provided feedback. Her next appointment is scheduled for 09/20/15.   Jamey Ripa, Ph.D Licensed Psychologist 09/12/2015

## 2015-09-13 ENCOUNTER — Encounter: Payer: Self-pay | Admitting: Psychology

## 2015-09-18 DIAGNOSIS — Z933 Colostomy status: Secondary | ICD-10-CM | POA: Diagnosis not present

## 2015-09-20 ENCOUNTER — Encounter: Payer: Self-pay | Admitting: Psychology

## 2015-09-20 ENCOUNTER — Ambulatory Visit (INDEPENDENT_AMBULATORY_CARE_PROVIDER_SITE_OTHER): Payer: Medicare HMO | Admitting: Psychology

## 2015-09-20 DIAGNOSIS — G3184 Mild cognitive impairment, so stated: Secondary | ICD-10-CM

## 2015-09-20 NOTE — Progress Notes (Addendum)
[facility]  [address]   Telephone [phone] [address] Fax [phone] [place], [place] [address]   NEUROPSYCHOLOGICAL EVALUATION  *CONFIDENTIAL* This report should not be released without the consent of the client  Name:   Bonnie Butler   Date of Birth:  2034/11/19 [facility] MR#:  [id number] Date of Evaluation: 09/12/15 & 09/20/15  Reason for Referral Bonnie Butler is a 81 year-old right-handed woman who was referred for neuropsychological evaluation by Marcial Pacas, MD of [facility] for an assessment of her current cognitive functioning.    As stated in Dr. Rhea Belton notes, Bonnie Butler first underwent neurological evaluation by Morene Antu, MD for complaints of memory loss in 06/09/2004. At that time, an MRI of the brain showed nonspecific hemisphere white matter changes. She was subsequently prescribed Aricept but was later switched to the Exelon patch by her primary care physician. She underwent neuropsychological evaluation with Velia Meyer, Ph.D in June 09, 2009 which identified borderline deficits on measures of processing speed, complex attention and immediate memory. Diagnostic impression was "Memory Loss, currently assumed to be transient and mild".   Sources of Information Electronic medical records from the [facility] were reviewed. A neuropsychological report dated 09-Jun-2009 supplied by her daughter was reviewed. Bonnie Butler, her daughter, Bonnie Butler, her son, Bonnie Butler, and her daughter-in-law, Ms. Chipper Herb, were interviewed.   Chief Complaints & Current Status According to her family members, Bonnie Butler has shown a progressive worsening of memory over the past ten years or so. Recent examples included her forgetting information told or texted to her, having difficulty keeping track of her grandchildren's birthdays and occasionally forgetting to pay some bills. She has appeared more alert since she began  taking memantine in early 06/10/15 though without apparent change in her memory. Bonnie Butler agreed that she has been having memory problems though wondered whether some of the examples offered by her family were more related to her progressive hearing loss. Family reported that she has a 100% hearing loss in her left ear and a 75% hearing loss in her right ear.   In addition to memory difficulties and hearing loss, she has exhibited reduced balance over the past six months without falls. Bonnie Butler denied any problems with eye-hand coordination, limb strength, vision, sleep, daytime alertness, appetite, swallowing, speech, smell or taste. She reported experiencing mild pain in her knees and back that has been effectively controlled by pain medication.  According to family members, she has been living alone in her home without incident. They have not observed her to have had problems completing her personal care activities, cleaning and maintaining her home, preparing simple meals, taking medications as prescribed or driving short distances. She has not exhibited any confused, unsafe or bizarre behavior.   Bonnie Butler described herself as usually in good spirits and maintaining her interests. She did report experiencing occasional, transient periods of feeling nervous for no apparent reason. She denied experiencing persisting sad mood, apathy, anhedonia, loss of interests, hopelessness, suicidal thoughts, mania, hallucinations and delusions. Her family described her as a bit more argumentative lately though did not cite any major changes in her mood or personality. She did not report any ongoing life stressors.  Background Bonnie Butler has been living alone in her home since the death of her husband in 06/10/98.   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 She reported that she has been  disabled from work since the 1980s due to ulcerative colitis.   She was graduated from high school  as a self-described average student without attentional or learning difficulties. She took some post-secondary courses in cosmetology.   Her past medical history was notable for chronic renal disease, gastroesophageal reflux disease, hypothyroidism, osteoarthritis, progressive hearing loss (right cochlear implantation), ulcerative colitis (ileostomy performed at age 55) and vitamin B12 deficiency.  She reported no history of head injury, loss of consciousness, seizure activity, stroke-like symptoms or exposure to toxic chemicals.   She denied abuse of alcohol or use of illicit drugs.  She reported no history of serious emotional difficulties or mental health contacts. She was reportedly diagnosed with depression in 2010 by her primary care physician and prescribed Lexapro. Bonnie Butler and family reported that her mood and energy level improved after taking Lexapro.   She was not aware of anyone in her family having been diagnosed with dementia.  Her current medications included alprazolam (used occasionally for insomnia), cyanocobalamin injections, escitalopram, levothyroxine, Lomotil as needed, memantine (started in January 2017), rivastigimine patch and tramadol.   Observations She appeared as an appropriately dressed and groomed woman in no apparent physical distress. She interacted in a pleasant manner. She spoke in a normal tone of voice, maintained eye contact and responded to all questions. She had a great deal of difficulty hearing conversational speech in the quiet office. No problems were evident for speech articulation, prosody, word selection, word finding, message coherence or language comprehension. Her mood seemed mildly anxious. Her affect appeared within a wide range and without lability. Her thought processes were coherent and organized without loose associations, verbal perseverations or flight of ideas. Her thought content was devoid of unusual or bizarre ideas.  Evaluation  Procedures In addition to a review of medical records as well as clinical interview, the following tests or questionnaires were administered: Animal Naming Test  [place]  Clock Drawing Test Controlled Oral Word Association Test  Geriatric Depression Scale  Modified [place] Card Sorting Test Rey Complex Figure: Copy Trail Making A & B Wechsler Adult Intelligence Scale-IV:      Music therapist, Coding, Digit Span & Similarities Wechsler Memory Scale-IV: Older Adult Battery  Assessment Results Observations & Interpretative Considerations Test results were overall deemed to represent a valid measure of her cognitive functioning although her hearing loss possibly confounded her performances on tests that required the processing of orally-presented information. She did not report or display problems with vision (she wore her reading glasses) or motor control.  She did not exhibit signs of physical or emotional discomfort. She appeared to consistently maintain alertness, sustain attention and persist to task.There were no signs of careless, impulsive or perseverative responding. She was judged to have put forth optimal effort.    Her pre-morbid intellectual potential was estimated to fall within the Average range based on demographic factors as well as a prior measure of her word reading skill.  Her test scores were corrected to reflect norms for her age and, whenever possible, her gender and educational level (i.e., 12 years).   Attention & Executive Function Her performances on measures of processing speed that required transcribing symbols to match digits using a key (Wechsler Adult Intelligence Scale-IV (WAIS-IV) Coding) or connecting numbers randomly arrayed on a page in numerical sequence (Trials A) fell within the Borderline range.    Her capacity to hold and manipulate information within working memory was generally subnormal. Her ability to mentally rearrange digit sequences in  reverse or in ascending numerical order (WAIS-IV Digit Span) was erratic. Her immediate recognition of symbols in left to right order (Wechsler Memory Scale-IV (WMS-IV) Symbol Span) was within the Borderline range.   She was unable to complete a test that required complex sequencing and mental shifting (Trails B).   Her verbal productivity, as measured by her abilities to name members of a category Banker) or generate words to designated letters (Controlled Oral Word Association Test) fell within the Low Average to Borderline ranges, respectively.   Her performance on a measure of verbal reasoning (WAIS-IV Similarities) that required classification of ostensibly different objects or ideas to a shared category was lower than expected within the Borderline range. A test of nonverbal problem-solving and conceptual flexibility that required inferring logical ways to sort geometric designs on the basis of ongoing feedback (Modified [employer]) was discontinued after she made repeated errors.  Learning & Memory On the WMS-IV, her Immediate Memory Index (IMI), a composite measure of her ability to recall verbal and visual information immediately after the stimuli was presented, fell within the Borderline range at the 5th percentile. She performed equivalently on auditory and visual immediate memory tasks. Her Delayed Memory Index (DMI), a combined measure of her ability to recall verbal and visual information after a 20 to 30 minute delay, fell within the Mildly Impaired range at the 2nd percentile. She predominantly demonstrated abnormal forgetting of visual information over the course of an approximate twenty minute delay interval. She was unable to reproduce any details of figural designs or adequately recognize them from multiple choices. On delayed auditory memory testing, she recalled a higher than expected number of word pairs but a slightly lower than expected amount of story  details as compared to her initial levels of encoding.     Language Her ability to name drawings of objects to confrontation [facility] Naming Test) fell within the Low Average range. As noted above, a measure of her word fluency was within the Borderline/mildly impaired range. A measure of word reading (Wide Range Achievement Test-4) fell within the Average range.   Visual-Spatial Organization & Visual-Construction  There were no signs of spatial inattention or problems with visual recognition. Her ability to assemble two-dimensional block designs from models Product manager) was within the Average range. In contrast, she showed signs of constructional apraxia on her drawings of a clock face (Clock Drawing Test) and of a spatially-complex geometric design (Rey Complex Figure).  Emotional Status Her score of 6/30 on the Geriatric Depression Scale was not suggestive of depression. The items she did endorse reflected her report of cognitive difficulties, social avoidance and lowered initiative.    Summary & Conclusions Bonnie Butler is a 81 year-old woman with an at least ten year history of gradually progressive memory decline. According to family, she currently lives alone and independently performs her instrumental activities of daily living with minimal to no difficulty. There were no reports of problems related to her mood, behavior or everyday judgment.   The majority of her performances on neurocognitive testing were within the subnormal range. She most prominently demonstrated impairments of visual memory at the stage of storage, visual-constructive skill and executive abilities involving mental tracking/set shifting and abstract reasoning/problem-solving. Her auditory memory was a relative strength within the Borderline/Low Average range, which was surprising given her significant hearing loss. Indeed, it is possible that her auditory memory was better than tested given the possible  confounding effect from her hearing deficit.  In conclusion, the current test results represent a significant decline in her cognitive functioning as compared to a prior psychometric baseline from 2011. Her concurrent decline in hearing has likely exacerbated her difficulty absorbing orally presented information though would clearly not account for her more generalized cognitive decline. Her neuropsychological profile would predict questionable competency to handle relatively complex activities of daily living or novel situations. Given that she reportedly lives independently without incident, her diagnosis would be Mild Cognitive Impairment-multiple domains though her neurocognitive test profile would be concerning for mild dementia.    Diagnostic Impression Mild Cognitive Impairment, multiple domains [G31.84]  Recommendations Her family members agreed that she is functioning independently without difficulty so no changes in living arrangement are recommended at this time. Family was advised to have someone check-in on her more frequently, at least once a week, to confirm that she is doing well and properly managing her daily activities. Some of the behavioral and functional signs of dementia were reviewed with family members to help them recognize these changes should they occur.   Neuropsychological re-evaluation is suggested in one to two years to track for changes, if any, in her adaptive and cognitive functioning.    I have appreciated the opportunity to evaluate Bonnie Butler. The results and recommendations from this evaluation were discussed with her, her daughter and daughter-in-law on 09/20/15. Please feel free to contact me with any comments or questions.   ______________________ Jamey Ripa, Ph.D Licensed Psychologist      Copy to: Ms. Marjean Donna (with written consent from Ms. Mordecai)        ADDENDUM-NEUROPSYCHOLOGICAL TEST RESULTS  Animal Naming Test Score=  13 13th (adjusted for age, gender and educational level)   Financial trader Score= [phone] 14th (adjusted for age, gender and educational level)   Clock Drawing Test Score= 6/10 Mild impairment    Controlled Oral Word Association Test Score=  19 words/0 repetition    5th (adjusted for age, gender and educational level)   Modified [place] Psychologist, clinical N/A discontinued, unable to understand   Rey Complex Figure: copy         Impaired due to misplacements and perseveration of design features   Trails A Score=  70s  0e 5th (adjusted for age, gender and educational level)  Trails B Score=  170s 5e d/c Impaired   Wechsler Adult Intelligence Scale-IV Subtest Scaled Score Percentile  Block Design    9 37th     Similarities    5   5th     Digit Span  Forward               Backward               Sequencing    8    8     14    2   25th   25th  91st     <1st         Coding      6   9th         Wechsler Memory Scale-IV Older Adult Battery Index Index Score Percentile  Immediate Memory  75  5th       Auditory Memory  80  9th      Visual Memory  62   1st      Delayed Memory  69   2nd      Symbol Span subtest  Scaled score=4  2nd

## 2015-09-27 DIAGNOSIS — H6993 Unspecified Eustachian tube disorder, bilateral: Secondary | ICD-10-CM | POA: Diagnosis not present

## 2015-09-27 DIAGNOSIS — H903 Sensorineural hearing loss, bilateral: Secondary | ICD-10-CM | POA: Diagnosis not present

## 2015-09-30 DIAGNOSIS — E039 Hypothyroidism, unspecified: Secondary | ICD-10-CM | POA: Diagnosis not present

## 2015-09-30 DIAGNOSIS — Z933 Colostomy status: Secondary | ICD-10-CM | POA: Diagnosis not present

## 2015-09-30 DIAGNOSIS — R69 Illness, unspecified: Secondary | ICD-10-CM | POA: Diagnosis not present

## 2015-09-30 DIAGNOSIS — E559 Vitamin D deficiency, unspecified: Secondary | ICD-10-CM | POA: Diagnosis not present

## 2015-09-30 DIAGNOSIS — G4700 Insomnia, unspecified: Secondary | ICD-10-CM | POA: Diagnosis not present

## 2015-09-30 DIAGNOSIS — R5383 Other fatigue: Secondary | ICD-10-CM | POA: Diagnosis not present

## 2015-09-30 DIAGNOSIS — K219 Gastro-esophageal reflux disease without esophagitis: Secondary | ICD-10-CM | POA: Diagnosis not present

## 2015-09-30 DIAGNOSIS — N189 Chronic kidney disease, unspecified: Secondary | ICD-10-CM | POA: Diagnosis not present

## 2015-09-30 DIAGNOSIS — E538 Deficiency of other specified B group vitamins: Secondary | ICD-10-CM | POA: Diagnosis not present

## 2015-10-10 DIAGNOSIS — Z933 Colostomy status: Secondary | ICD-10-CM | POA: Diagnosis not present

## 2015-10-14 DIAGNOSIS — I10 Essential (primary) hypertension: Secondary | ICD-10-CM | POA: Diagnosis not present

## 2015-10-16 ENCOUNTER — Encounter: Payer: Self-pay | Admitting: Neurology

## 2015-10-16 ENCOUNTER — Ambulatory Visit (INDEPENDENT_AMBULATORY_CARE_PROVIDER_SITE_OTHER): Payer: Medicare HMO | Admitting: Neurology

## 2015-10-16 VITALS — BP 128/75 | HR 75 | Ht 64.0 in | Wt 131.0 lb

## 2015-10-16 DIAGNOSIS — G3184 Mild cognitive impairment, so stated: Secondary | ICD-10-CM | POA: Diagnosis not present

## 2015-10-16 DIAGNOSIS — R269 Unspecified abnormalities of gait and mobility: Secondary | ICD-10-CM

## 2015-10-16 MED ORDER — MEMANTINE HCL 10 MG PO TABS: 10.0000 mg | ORAL_TABLET | Freq: Two times a day (BID) | ORAL | 4 refills | Status: DC

## 2015-10-16 NOTE — Progress Notes (Signed)
Chief Complaint  Patient presents with  . Memory Loss    MMSE 26/30 - 11 animals.  She is here with her daughter, Bonnie Butler.  Feels memory is unchanged.  They would like to further review her neuropsychiatric testing by Bonnie Butler.  She is using Exelon and Namenda.      PATIENT: Bonnie Butler DOB: April 09, 1934  Chief Complaint  Patient presents with  . Memory Loss    MMSE 26/30 - 11 animals.  She is here with her daughter, Bonnie Butler.  Feels memory is unchanged.  They would like to further review her neuropsychiatric testing by Bonnie Butler.  She is using Exelon and Namenda.     HISTORICAL  Bonnie Butler is a 81 years old right-handed female, accompanied by her daughter Bonnie Butler, seen in refer by her primary care physician Dr. Ernestene Butler in January 79 2017 for evaluation of memory loss, unsteady gait tendency to lean backwards.  She had a history of ulcerative colitis, had a history of ileostomy since 1978,  history of progressive hearing loss had right cochlear implantation, still has poor hearing, wearing left hearing aid, long-term Coumadin for positive lupus anticoagulants, vitamin B12 deficiency, hypothyroidism, on supplement,  I have reviewed and summarized her previous neurological record from Bonnie Butler in 2006, she was evaluated for similar complaints of progressive memory loss, per record, MRI of the brain in 2006 showed nonspecific hemisphere white matter changes  She graduates from high school, used to work as a International aid/development worker job, 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 She has been treated with Exelon patch 9.6 milligrams daily  She was noted to have slow worsening memory trouble tends to repeat herself, no longer cooking  regularly, difficulty to keep up with her grandchildren's birthday, she also has gradual onset gait difficulty over the past few years, hold on furniture to walk, tendency to fell backwards, dizziness spinning sensation sometimes  We have reviewed her CAT scan in 2017, there was right cochlear implant, generalized atrophy, especially superior cerebellum atrophy, no acute lesions.  Update April 10 2015: Overall, her gait has much improved, she only take meclizine as needed for dizziness, complains of drowsiness with medications,  We have reviewed the film of CAT scan of cervical spine in April 01 2015 from [facility] radiology at [facility], there is broad-based disc osteophyte complex and uncovertebral spurring at C6-7 with moderate foraminal stenosis, left greater than right, moderate foraminal stenosis is also present at T1-2, worse on the left, shallow disc protrusion at C2-3, C3-4 without significant stenosis, mild left foraminal narrowing at C4-5, asymmetric left-sided facet hypertrophy at C4-5, C5-6, disease progressed since 2011 including C6 and 7  She was taking Namenda 10 mg daily instead of twice a day, tolerating medication well, daughter reported slow worsening memory trouble, making mistakes on her bill, more confused about her medications  UPDATE August 16th 2017: She is with her daughter at today's clinical visit, continue lives alone, she still drives, she drive 30 miles to visit her sister who suffered dementia, stated nursing home, her gait has improved with physical therapy she denies significant pain,  She had neuropsychiatric evaluation by Bonnie Butler in July 2017, went over findings with patient and her daughter in detail, 7 normal range, she  demonstrate impairments at visual memory, visual constructive scale, executive ability, abstract reasoning/problem solving, testing represent a significant decline in her cognitive function, compared to previous baseline in 2011,  suggested diagnosis of mild cognitive impairment versus mild dementia,   REVIEW OF SYSTEMS: Full 14 system review of systems performed and notable only for as above  ALLERGIES: Allergies  Allergen Reactions  . Penicillins Rash  . Sulfa Antibiotics Rash    Other Reaction: Other reaction  . Macrobid [Nitrofurantoin] Rash  . Rocephin [Ceftriaxone Sodium In Dextrose] Rash  . Tape Rash    HOME MEDICATIONS: Current Outpatient Prescriptions  Medication Sig Dispense Refill  . ALPRAZolam (XANAX) 0.5 MG tablet Take 0.5 mg by mouth 2 (two) times daily as needed.    . cyanocobalamin (,VITAMIN B-12,) 1000 MCG/ML injection Inject into the muscle every 30 (thirty) days.    . diphenoxylate-atropine (LOMOTIL) 2.5-0.025 MG tablet Take by mouth as needed.    Marland Kitchen escitalopram (LEXAPRO) 20 MG tablet Take 20 mg by mouth daily.  5  . hypromellose (GENTEAL SEVERE) 0.3 % GEL ophthalmic ointment Place into both eyes as needed.    Marland Kitchen levothyroxine (SYNTHROID, LEVOTHROID) 50 MCG tablet Take 50 mcg by mouth daily.  11  . loperamide (IMODIUM) 2 MG capsule Take 4 mg by mouth as needed.    . memantine (NAMENDA) 10 MG tablet Take 1 tablet (10 mg total) by mouth 2 (two) times daily. 60 tablet 11  . omeprazole (PRILOSEC) 40 MG capsule Take 40 mg by mouth daily.    . ondansetron (ZOFRAN) 4 MG tablet Take 4 mg by mouth as needed.    . rivastigmine (EXELON) 9.5 mg/24hr daily.    . traMADol (ULTRAM) 50 MG tablet Take 50 mg by mouth every 6 (six) hours as needed. for pain  5   No current facility-administered medications for this visit.     PAST MEDICAL HISTORY: Past Medical History:  Diagnosis Date  . Anxiety   . Broken upper arm    right  . Chronic renal disease, stage 3, moderately decreased glomerular filtration rate between 30-59 mL/min/1.73 square meter   . DDD (degenerative disc disease), cervical   . Dementia   . Depression   . Dry eyes   . GERD (gastroesophageal reflux disease)   . H/O ileostomy ([place])     . Hearing loss   . Hypothyroidism   . Low protein C activity level (HCC)   . Memory loss   . Osteoarthritis   . Renal cyst   . Short gut syndrome   . Ulcerative colitis ([place])   . Vertigo     PAST SURGICAL HISTORY: Past Surgical History:  Procedure Laterality Date  . APPENDECTOMY    . CATARACT EXTRACTION     bilateral  . COCHLEAR IMPLANT     right  . colectomy    . ILEOSTOMY    . VAGINAL HYSTERECTOMY      FAMILY HISTORY: Family History  Problem Relation Age of Onset  . Congestive Heart Failure Mother   . Kidney disease Father   . Lung cancer Brother   . Stroke Sister   . COPD Sister     SOCIAL HISTORY:  Social History   Social History  . Marital status: Widowed    Spouse name: N/A  . Number of children: 3  . Years of education: 12   Occupational History  . Retired    Social History Main Topics  . Smoking status: Never Smoker  . Smokeless tobacco: Not  on file  . Alcohol use No  . Drug use: No  . Sexual activity: Not on file   Other Topics Concern  . Not on file   Social History Narrative   Lives at home alone.   Right-handed.   Occasional caffeine use.     PHYSICAL EXAM   Vitals:   10/16/15 1116  BP: 128/75  Pulse: 75  Weight: 131 lb (59.4 kg)  Height: 5\' 4"  (1.626 m)    Not recorded      Body mass index is 22.49 kg/m.  PHYSICAL EXAMNIATION:  Gen: NAD, conversant, well nourised, obese, well groomed                     Cardiovascular: Regular rate rhythm, no peripheral edema, warm, nontender. Eyes: Conjunctivae clear without exudates or hemorrhage Neck: Supple, no carotid bruise. Pulmonary: Clear to auscultation bilaterally   NEUROLOGICAL EXAM:  MENTAL STATUS: Speech:    Mildly slurred speech, hard of hearing.  Cognition: Mini-Mental Status Examination is 26 out of 30     Orientation to time, place and person     Recent and remote memory: She missed 3 out of 3 recalls     Normal Attention span and concentration     Normal  Language, naming, repeating,spontaneous speech     Fund of knowledge   CRANIAL NERVES: CN II: Visual fields are full to confrontation. Fundoscopic exam is normal with sharp discs and no vascular changes. Pupils are round equal and briskly reactive to light. CN III, IV, VI: extraocular movement are normal. No ptosis. CN V: Facial sensation is intact to pinprick in all 3 divisions bilaterally. Corneal responses are intact.  CN VII: Face is symmetric with normal eye closure and smile. CN VIII: She has hard of hearing CN IX, X: Palate elevates symmetrically. Phonation is normal. CN XI: Head turning and shoulder shrug are intact CN XII: Tongue is midline with normal movements and no atrophy.  MOTOR: There is no pronator drift of out-stretched arms. Muscle bulk and tone are normal. Muscle strength is normal.  REFLEXES: Reflexes are 3+ and symmetric at the biceps, triceps, knees, and ankles. Plantar responses are flexor.  SENSORY: Intact to light touch, pinprick, position sense, and vibration sense are intact in fingers and toes.  COORDINATION: Rapid alternating movements and fine finger movements are intact. There is no dysmetria on finger-to-nose and heel-knee-shin. Mild trunk ataxia   GAIT/STANCE: She needs to push up to get up from seated position, steady,, no significant trunk ataxia, could not perform tandem walking  Romberg signs was negative   DIAGNOSTIC DATA (LABS, IMAGING, TESTING) - I reviewed patient records, labs, notes, testing and imaging myself where available.   ASSESSMENT AND PLAN  RIGBY GROVE is a 81 y.o. female    Unsteady gait  Multifactorial, this includes cerebellar degeneration, possible inner ear/ vestibular system malfunction consistent with her sensory neuronal hearing loss,   CAT scan of cervical spine showed multilevel degenerative changes, especially at C6-7, no evidence of cervical myelopathy   Mild dementia without behavioral issues  her  depression anxiety polypharmacy likely contributed to her complains of memory loss as well  She does has family history of dementia  Continue current medications of Exelon patch, Namenda 10 mg twice a day  Also advises her daughter observing her driving ability, restricted her driving to daytime, short distance, familiar route  Marcial Pacas, M.D. Ph.D.  [facility] [address] [place], [place]  [id number] Ph: [phone] Fax: [phone]  CC: Bonnie Kiel, MD

## 2015-10-18 DIAGNOSIS — Z933 Colostomy status: Secondary | ICD-10-CM | POA: Diagnosis not present

## 2015-10-22 DIAGNOSIS — Z933 Colostomy status: Secondary | ICD-10-CM | POA: Diagnosis not present

## 2015-10-28 DIAGNOSIS — Z933 Colostomy status: Secondary | ICD-10-CM | POA: Diagnosis not present

## 2015-11-07 DIAGNOSIS — Z933 Colostomy status: Secondary | ICD-10-CM | POA: Diagnosis not present

## 2015-11-18 DIAGNOSIS — Z933 Colostomy status: Secondary | ICD-10-CM | POA: Diagnosis not present

## 2015-11-26 DIAGNOSIS — E538 Deficiency of other specified B group vitamins: Secondary | ICD-10-CM | POA: Diagnosis not present

## 2015-11-28 DIAGNOSIS — Z933 Colostomy status: Secondary | ICD-10-CM | POA: Diagnosis not present

## 2015-12-09 DIAGNOSIS — Z933 Colostomy status: Secondary | ICD-10-CM | POA: Diagnosis not present

## 2015-12-11 DIAGNOSIS — E538 Deficiency of other specified B group vitamins: Secondary | ICD-10-CM | POA: Diagnosis not present

## 2015-12-12 DIAGNOSIS — R69 Illness, unspecified: Secondary | ICD-10-CM | POA: Diagnosis not present

## 2015-12-20 DIAGNOSIS — Z933 Colostomy status: Secondary | ICD-10-CM | POA: Diagnosis not present

## 2015-12-31 DIAGNOSIS — Z933 Colostomy status: Secondary | ICD-10-CM | POA: Diagnosis not present

## 2016-01-08 DIAGNOSIS — R69 Illness, unspecified: Secondary | ICD-10-CM | POA: Diagnosis not present

## 2016-01-10 DIAGNOSIS — Z933 Colostomy status: Secondary | ICD-10-CM | POA: Diagnosis not present

## 2016-01-20 DIAGNOSIS — Z933 Colostomy status: Secondary | ICD-10-CM | POA: Diagnosis not present

## 2016-01-30 DIAGNOSIS — E039 Hypothyroidism, unspecified: Secondary | ICD-10-CM | POA: Diagnosis not present

## 2016-01-30 DIAGNOSIS — R252 Cramp and spasm: Secondary | ICD-10-CM | POA: Diagnosis not present

## 2016-01-30 DIAGNOSIS — Z79899 Other long term (current) drug therapy: Secondary | ICD-10-CM | POA: Diagnosis not present

## 2016-01-30 DIAGNOSIS — N189 Chronic kidney disease, unspecified: Secondary | ICD-10-CM | POA: Diagnosis not present

## 2016-01-30 DIAGNOSIS — R6889 Other general symptoms and signs: Secondary | ICD-10-CM | POA: Diagnosis not present

## 2016-01-30 DIAGNOSIS — E559 Vitamin D deficiency, unspecified: Secondary | ICD-10-CM | POA: Diagnosis not present

## 2016-01-30 DIAGNOSIS — K219 Gastro-esophageal reflux disease without esophagitis: Secondary | ICD-10-CM | POA: Diagnosis not present

## 2016-01-30 DIAGNOSIS — R197 Diarrhea, unspecified: Secondary | ICD-10-CM | POA: Diagnosis not present

## 2016-01-31 DIAGNOSIS — Z933 Colostomy status: Secondary | ICD-10-CM | POA: Diagnosis not present

## 2016-02-07 DIAGNOSIS — Z933 Colostomy status: Secondary | ICD-10-CM | POA: Diagnosis not present

## 2016-02-12 DIAGNOSIS — K859 Acute pancreatitis without necrosis or infection, unspecified: Secondary | ICD-10-CM | POA: Diagnosis not present

## 2016-02-12 DIAGNOSIS — K219 Gastro-esophageal reflux disease without esophagitis: Secondary | ICD-10-CM | POA: Diagnosis not present

## 2016-02-12 DIAGNOSIS — K439 Ventral hernia without obstruction or gangrene: Secondary | ICD-10-CM | POA: Diagnosis not present

## 2016-02-12 DIAGNOSIS — E538 Deficiency of other specified B group vitamins: Secondary | ICD-10-CM | POA: Diagnosis not present

## 2016-02-12 DIAGNOSIS — K8681 Exocrine pancreatic insufficiency: Secondary | ICD-10-CM | POA: Diagnosis not present

## 2016-02-12 DIAGNOSIS — Z932 Ileostomy status: Secondary | ICD-10-CM | POA: Diagnosis not present

## 2016-02-12 DIAGNOSIS — K5190 Ulcerative colitis, unspecified, without complications: Secondary | ICD-10-CM | POA: Diagnosis not present

## 2016-02-12 DIAGNOSIS — R69 Illness, unspecified: Secondary | ICD-10-CM | POA: Diagnosis not present

## 2016-02-12 DIAGNOSIS — D649 Anemia, unspecified: Secondary | ICD-10-CM | POA: Diagnosis not present

## 2016-02-12 DIAGNOSIS — D6862 Lupus anticoagulant syndrome: Secondary | ICD-10-CM | POA: Diagnosis not present

## 2016-02-12 DIAGNOSIS — R7989 Other specified abnormal findings of blood chemistry: Secondary | ICD-10-CM | POA: Diagnosis not present

## 2016-02-12 DIAGNOSIS — K839 Disease of biliary tract, unspecified: Secondary | ICD-10-CM | POA: Diagnosis not present

## 2016-02-12 DIAGNOSIS — R1013 Epigastric pain: Secondary | ICD-10-CM | POA: Diagnosis not present

## 2016-02-12 DIAGNOSIS — R197 Diarrhea, unspecified: Secondary | ICD-10-CM | POA: Diagnosis not present

## 2016-02-12 DIAGNOSIS — E039 Hypothyroidism, unspecified: Secondary | ICD-10-CM | POA: Diagnosis not present

## 2016-02-12 DIAGNOSIS — N179 Acute kidney failure, unspecified: Secondary | ICD-10-CM | POA: Diagnosis not present

## 2016-02-12 DIAGNOSIS — K861 Other chronic pancreatitis: Secondary | ICD-10-CM | POA: Diagnosis not present

## 2016-02-12 DIAGNOSIS — R1084 Generalized abdominal pain: Secondary | ICD-10-CM | POA: Diagnosis not present

## 2016-02-12 DIAGNOSIS — R110 Nausea: Secondary | ICD-10-CM | POA: Diagnosis not present

## 2016-02-12 DIAGNOSIS — N183 Chronic kidney disease, stage 3 (moderate): Secondary | ICD-10-CM | POA: Diagnosis not present

## 2016-02-13 DIAGNOSIS — K861 Other chronic pancreatitis: Secondary | ICD-10-CM | POA: Diagnosis not present

## 2016-02-13 DIAGNOSIS — K8681 Exocrine pancreatic insufficiency: Secondary | ICD-10-CM | POA: Diagnosis not present

## 2016-02-13 DIAGNOSIS — R69 Illness, unspecified: Secondary | ICD-10-CM | POA: Diagnosis not present

## 2016-02-13 DIAGNOSIS — K219 Gastro-esophageal reflux disease without esophagitis: Secondary | ICD-10-CM | POA: Diagnosis not present

## 2016-02-13 DIAGNOSIS — N183 Chronic kidney disease, stage 3 (moderate): Secondary | ICD-10-CM | POA: Diagnosis not present

## 2016-02-13 DIAGNOSIS — N179 Acute kidney failure, unspecified: Secondary | ICD-10-CM | POA: Diagnosis not present

## 2016-02-13 DIAGNOSIS — D6862 Lupus anticoagulant syndrome: Secondary | ICD-10-CM | POA: Diagnosis not present

## 2016-02-13 DIAGNOSIS — Z932 Ileostomy status: Secondary | ICD-10-CM | POA: Diagnosis not present

## 2016-02-13 DIAGNOSIS — E039 Hypothyroidism, unspecified: Secondary | ICD-10-CM | POA: Diagnosis not present

## 2016-02-13 DIAGNOSIS — D649 Anemia, unspecified: Secondary | ICD-10-CM | POA: Diagnosis not present

## 2016-02-14 DIAGNOSIS — N183 Chronic kidney disease, stage 3 (moderate): Secondary | ICD-10-CM | POA: Diagnosis not present

## 2016-02-14 DIAGNOSIS — K861 Other chronic pancreatitis: Secondary | ICD-10-CM | POA: Diagnosis not present

## 2016-02-14 DIAGNOSIS — Z932 Ileostomy status: Secondary | ICD-10-CM | POA: Diagnosis not present

## 2016-02-14 DIAGNOSIS — E039 Hypothyroidism, unspecified: Secondary | ICD-10-CM | POA: Diagnosis not present

## 2016-02-14 DIAGNOSIS — K8681 Exocrine pancreatic insufficiency: Secondary | ICD-10-CM | POA: Diagnosis not present

## 2016-02-14 DIAGNOSIS — D6862 Lupus anticoagulant syndrome: Secondary | ICD-10-CM | POA: Diagnosis not present

## 2016-02-14 DIAGNOSIS — N179 Acute kidney failure, unspecified: Secondary | ICD-10-CM | POA: Diagnosis not present

## 2016-02-17 DIAGNOSIS — Z933 Colostomy status: Secondary | ICD-10-CM | POA: Diagnosis not present

## 2016-02-18 DIAGNOSIS — Z79899 Other long term (current) drug therapy: Secondary | ICD-10-CM | POA: Diagnosis not present

## 2016-02-18 DIAGNOSIS — R197 Diarrhea, unspecified: Secondary | ICD-10-CM | POA: Diagnosis not present

## 2016-02-18 DIAGNOSIS — K861 Other chronic pancreatitis: Secondary | ICD-10-CM | POA: Diagnosis not present

## 2016-02-18 DIAGNOSIS — N189 Chronic kidney disease, unspecified: Secondary | ICD-10-CM | POA: Diagnosis not present

## 2016-02-18 DIAGNOSIS — K219 Gastro-esophageal reflux disease without esophagitis: Secondary | ICD-10-CM | POA: Diagnosis not present

## 2016-03-03 DIAGNOSIS — K861 Other chronic pancreatitis: Secondary | ICD-10-CM | POA: Diagnosis not present

## 2016-03-03 DIAGNOSIS — K591 Functional diarrhea: Secondary | ICD-10-CM | POA: Diagnosis not present

## 2016-03-03 DIAGNOSIS — Z933 Colostomy status: Secondary | ICD-10-CM | POA: Diagnosis not present

## 2016-03-03 DIAGNOSIS — R630 Anorexia: Secondary | ICD-10-CM | POA: Diagnosis not present

## 2016-03-03 DIAGNOSIS — N183 Chronic kidney disease, stage 3 (moderate): Secondary | ICD-10-CM | POA: Diagnosis not present

## 2016-03-03 DIAGNOSIS — K219 Gastro-esophageal reflux disease without esophagitis: Secondary | ICD-10-CM | POA: Diagnosis not present

## 2016-03-10 DIAGNOSIS — R1084 Generalized abdominal pain: Secondary | ICD-10-CM | POA: Diagnosis not present

## 2016-03-10 DIAGNOSIS — N189 Chronic kidney disease, unspecified: Secondary | ICD-10-CM | POA: Diagnosis not present

## 2016-03-11 DIAGNOSIS — E860 Dehydration: Secondary | ICD-10-CM | POA: Diagnosis not present

## 2016-03-12 DIAGNOSIS — Z933 Colostomy status: Secondary | ICD-10-CM | POA: Diagnosis not present

## 2016-03-13 DIAGNOSIS — N189 Chronic kidney disease, unspecified: Secondary | ICD-10-CM | POA: Diagnosis not present

## 2016-03-19 DIAGNOSIS — N179 Acute kidney failure, unspecified: Secondary | ICD-10-CM | POA: Diagnosis not present

## 2016-03-19 DIAGNOSIS — K219 Gastro-esophageal reflux disease without esophagitis: Secondary | ICD-10-CM | POA: Diagnosis not present

## 2016-03-19 DIAGNOSIS — N289 Disorder of kidney and ureter, unspecified: Secondary | ICD-10-CM | POA: Diagnosis not present

## 2016-03-19 DIAGNOSIS — Z79899 Other long term (current) drug therapy: Secondary | ICD-10-CM | POA: Diagnosis not present

## 2016-03-19 DIAGNOSIS — Z932 Ileostomy status: Secondary | ICD-10-CM | POA: Diagnosis not present

## 2016-03-19 DIAGNOSIS — R531 Weakness: Secondary | ICD-10-CM | POA: Diagnosis not present

## 2016-03-19 DIAGNOSIS — R69 Illness, unspecified: Secondary | ICD-10-CM | POA: Diagnosis not present

## 2016-03-19 DIAGNOSIS — N183 Chronic kidney disease, stage 3 (moderate): Secondary | ICD-10-CM | POA: Diagnosis not present

## 2016-03-23 DIAGNOSIS — Z933 Colostomy status: Secondary | ICD-10-CM | POA: Diagnosis not present

## 2016-03-24 DIAGNOSIS — N189 Chronic kidney disease, unspecified: Secondary | ICD-10-CM | POA: Diagnosis not present

## 2016-04-01 DIAGNOSIS — E860 Dehydration: Secondary | ICD-10-CM | POA: Diagnosis not present

## 2016-04-02 DIAGNOSIS — K861 Other chronic pancreatitis: Secondary | ICD-10-CM | POA: Diagnosis not present

## 2016-04-02 DIAGNOSIS — K591 Functional diarrhea: Secondary | ICD-10-CM | POA: Diagnosis not present

## 2016-04-02 DIAGNOSIS — K219 Gastro-esophageal reflux disease without esophagitis: Secondary | ICD-10-CM | POA: Diagnosis not present

## 2016-04-02 DIAGNOSIS — N189 Chronic kidney disease, unspecified: Secondary | ICD-10-CM | POA: Diagnosis not present

## 2016-04-03 DIAGNOSIS — Z933 Colostomy status: Secondary | ICD-10-CM | POA: Diagnosis not present

## 2016-04-08 DIAGNOSIS — N183 Chronic kidney disease, stage 3 (moderate): Secondary | ICD-10-CM | POA: Diagnosis not present

## 2016-04-08 DIAGNOSIS — D631 Anemia in chronic kidney disease: Secondary | ICD-10-CM | POA: Diagnosis not present

## 2016-04-08 DIAGNOSIS — N2581 Secondary hyperparathyroidism of renal origin: Secondary | ICD-10-CM | POA: Diagnosis not present

## 2016-04-09 DIAGNOSIS — B309 Viral conjunctivitis, unspecified: Secondary | ICD-10-CM | POA: Diagnosis not present

## 2016-04-14 DIAGNOSIS — Z933 Colostomy status: Secondary | ICD-10-CM | POA: Diagnosis not present

## 2016-04-20 ENCOUNTER — Ambulatory Visit: Payer: Medicare HMO | Admitting: Nurse Practitioner

## 2016-04-27 DIAGNOSIS — Z933 Colostomy status: Secondary | ICD-10-CM | POA: Diagnosis not present

## 2016-04-28 DIAGNOSIS — E860 Dehydration: Secondary | ICD-10-CM | POA: Diagnosis not present

## 2016-04-30 DIAGNOSIS — K861 Other chronic pancreatitis: Secondary | ICD-10-CM | POA: Diagnosis not present

## 2016-04-30 DIAGNOSIS — Z9049 Acquired absence of other specified parts of digestive tract: Secondary | ICD-10-CM | POA: Diagnosis not present

## 2016-04-30 DIAGNOSIS — R1084 Generalized abdominal pain: Secondary | ICD-10-CM | POA: Diagnosis not present

## 2016-04-30 DIAGNOSIS — Z932 Ileostomy status: Secondary | ICD-10-CM | POA: Diagnosis not present

## 2016-04-30 DIAGNOSIS — G8929 Other chronic pain: Secondary | ICD-10-CM | POA: Diagnosis not present

## 2016-04-30 DIAGNOSIS — Z8719 Personal history of other diseases of the digestive system: Secondary | ICD-10-CM | POA: Diagnosis not present

## 2016-04-30 DIAGNOSIS — R197 Diarrhea, unspecified: Secondary | ICD-10-CM | POA: Diagnosis not present

## 2016-05-07 DIAGNOSIS — E538 Deficiency of other specified B group vitamins: Secondary | ICD-10-CM | POA: Diagnosis not present

## 2016-05-11 DIAGNOSIS — Z933 Colostomy status: Secondary | ICD-10-CM | POA: Diagnosis not present

## 2016-05-12 ENCOUNTER — Encounter: Payer: Self-pay | Admitting: Nurse Practitioner

## 2016-05-12 ENCOUNTER — Ambulatory Visit (INDEPENDENT_AMBULATORY_CARE_PROVIDER_SITE_OTHER): Payer: Medicare HMO | Admitting: Nurse Practitioner

## 2016-05-12 VITALS — BP 138/58 | HR 76 | Ht 64.0 in | Wt 127.0 lb

## 2016-05-12 DIAGNOSIS — R269 Unspecified abnormalities of gait and mobility: Secondary | ICD-10-CM | POA: Diagnosis not present

## 2016-05-12 DIAGNOSIS — G3184 Mild cognitive impairment, so stated: Secondary | ICD-10-CM | POA: Diagnosis not present

## 2016-05-12 NOTE — Progress Notes (Signed)
[facility]  PATIENT: Bonnie Butler DOB: [birth year]   REASON FOR VISIT: Follow-up for mild cognitive impairment,  abnormality of gait HISTORY FROM: Patient and daughter Bonnie Butler POA    HISTORY OF PRESENT ILLNESS: Bonnie Butler is a 82 years old right-handed female, accompanied by her daughter Bonnie Butler, seen in refer by her primary care physician Dr. Ernestene Butler in March 26 2015 for evaluation of memory loss, unsteady gait tendency to lean backwards.  She had a history of ulcerative colitis, had a history of ileostomy since 1978,  history of progressive hearing loss had right cochlear implantation, still has poor hearing, wearing left hearing aid, long-term Coumadin for positive lupus anticoagulants, vitamin B12 deficiency, hypothyroidism, on supplement,  I have reviewed and summarized her previous neurological record from Dr. 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 She has been treated with Exelon patch 9.6 milligrams daily  She was noted to have slow worsening memory trouble tends to repeat herself, no longer cooking regularly, difficulty to keep up with her grandchildren's birthday, she also has gradual onset gait difficulty over the past few years, hold on furniture to walk, tendency to fell backwards, dizziness spinning sensation sometimes  We have reviewed her CAT scan in 2017, there was right cochlear implant, generalized atrophy, especially superior  cerebellum atrophy, no acute lesions.  Update April 10 2015:Overall, her gait has much improved, she only take meclizine as needed for dizziness, complains of drowsiness with medications, We have reviewed the film of CAT scan of cervical spine in April 01 2015 from [facility] radiology at [facility], there is broad-based disc osteophyte complex and uncovertebral spurring at C6-7 with moderate foraminal stenosis, left greater than right, moderate foraminal stenosis is also present at T1-2, worse on the left, shallow disc protrusion at C2-3, C3-4 without significant stenosis, mild left foraminal narrowing at C4-5, asymmetric left-sided facet hypertrophy at C4-5, C5-6, disease progressed since 2011 including C6 and 7  She was taking Namenda 10 mg daily instead of twice a day, tolerating medication well, daughter reported slow worsening memory trouble, making mistakes on her bill, more confused about her medications  UPDATE August 16th 2017:YY She is with her daughter at today's clinical visit, continue lives alone, she still drives, she drive 30 miles to visit her sister who suffered dementia, stated nursing home, her gait has improved with physical therapy she denies significant pain,  She had neuropsychiatric evaluation by Dr. Valentina Butler in July 2017, went over findings with patient and her daughter in detail, 7 normal range, she demonstrate impairments at visual memory, visual constructive scale, executive ability, abstract reasoning/problem solving, testing represent a significant decline in her cognitive function, compared to previous baseline in 2011, suggested diagnosis of mild cognitive impairment versus mild dementia,  UPDATE 03/13/2018CM Ms. Butler, 82 year old female returns for follow-up with her daughter who is her power of attorney. She has a history of mild cognitive impairment and is currently on Exelon and Namenda without side effects. MMSE score today is stable at 29 out of  15.  She had evaluation by Dr. Valentina Butler July of last year which suggested diagnosis of mild cognitive impairment versus mild dementia. She continues to live alone, there have been no safety issues identified she was hospitalized in December for acute pancreatitis. Since that time there has been more family involvement to check on her frequently. She continues to cook. She remains fairly independent in activities of daily living. Daughter does her finances. Patient just had her driver's license renewed and drives short distances in her community. She returns for reevaluation. She also has a history of depression. She has not had any recent falls  REVIEW OF SYSTEMS: Full 14 system review of systems performed and notable only for those listed, all others are neg:  Constitutional: Fatigue  Cardiovascular: neg Ear/Nose/Throat: Hearing loss Skin: neg Eyes: neg Respiratory: neg Gastroitestinal: Diarrhea Hematology/Lymphatic: neg  Endocrine: neg Musculoskeletal: Joint pain Allergy/Immunology: neg Neurological: Memory loss Psychiatric: Depression and anxiety Sleep : neg   ALLERGIES: Allergies  Allergen Reactions  . Penicillins Rash  . Sulfa Antibiotics Rash    Other Reaction: Other reaction  . Macrobid [Nitrofurantoin] Rash  . Rocephin [Ceftriaxone Sodium In Dextrose] Rash  . Tape Rash    HOME MEDICATIONS: Outpatient Medications Prior to Visit  Medication Sig Dispense Refill  . ALPRAZolam (XANAX) 0.5 MG tablet Take 0.5 mg by mouth 2 (two) times daily as needed.    . cyanocobalamin (,VITAMIN B-12,) 1000 MCG/ML injection Inject into the muscle every 30 (thirty) days.    . diphenoxylate-atropine (LOMOTIL) 2.5-0.025 MG tablet Take by mouth as needed.    Marland Kitchen escitalopram (LEXAPRO) 20 MG tablet Take 20 mg by mouth daily.  5  . hypromellose (GENTEAL SEVERE) 0.3 % GEL ophthalmic ointment Place into both eyes as needed.    Marland Kitchen levothyroxine (SYNTHROID, LEVOTHROID) 50 MCG tablet Take 50 mcg by mouth daily.   11  . loperamide (IMODIUM) 2 MG capsule Take 4 mg by mouth as needed.    . memantine (NAMENDA) 10 MG tablet Take 1 tablet (10 mg total) by mouth 2 (two) times daily. 180 tablet 4  . ondansetron (ZOFRAN) 4 MG tablet Take 4 mg by mouth as needed.    . rivastigmine (EXELON) 9.5 mg/24hr daily.    . traMADol (ULTRAM) 50 MG tablet Take 50 mg by mouth every 6 (six) hours as needed. for pain  5  . omeprazole (PRILOSEC) 40 MG capsule Take 40 mg by mouth daily.     No facility-administered medications prior to visit.     PAST MEDICAL HISTORY: Past Medical History:  Diagnosis Date  . Anxiety   . Broken upper arm    right  . Chronic renal disease, stage 3, moderately decreased glomerular filtration rate between 30-59 mL/min/1.73 square meter   . DDD (degenerative disc disease), cervical   . Dementia   . Depression   . Dry eyes   . GERD (gastroesophageal reflux disease)   . H/O ileostomy   . Hearing loss   . Hypothyroidism   . Low protein C activity level (HCC)   . Memory loss   . Osteoarthritis   . Renal cyst   . Short gut syndrome   . Ulcerative colitis ([place])   . Vertigo     PAST SURGICAL HISTORY: Past Surgical History:  Procedure Laterality Date  . APPENDECTOMY    . CATARACT EXTRACTION     bilateral  . COCHLEAR IMPLANT     right  . colectomy    . ILEOSTOMY    .  VAGINAL HYSTERECTOMY      FAMILY HISTORY: Family History  Problem Relation Age of Onset  . Congestive Heart Failure Mother   . Kidney disease Father   . Lung cancer Brother   . Stroke Sister   . COPD Sister     SOCIAL HISTORY: Social History   Social History  . Marital status: Widowed    Spouse name: N/A  . Number of children: 3  . Years of education: 12   Occupational History  . Retired    Social History Main Topics  . Smoking status: Never Smoker  . Smokeless tobacco: Never Used  . Alcohol use No  . Drug use: No  . Sexual activity: Not on file   Other Topics Concern  . Not on file    Social History Narrative   Lives at home alone.   Right-handed.   Occasional caffeine use.     PHYSICAL EXAM  Vitals:   05/12/16 1316  BP: (!) 138/58  Pulse: 76  Weight: 127 lb (57.6 kg)  Height: 5\' 4"  (1.626 m)   Body mass index is 21.8 kg/m.  Generalized: Well developed, in no acute distress  Head: normocephalic and atraumatic,. Oropharynx benign  Neck: Supple, no carotid bruits  Cardiac: Regular rate rhythm, no murmur  Musculoskeletal: No deformity   Neurological examination   Mentation: Alert oriented to time, place, history taking. Attention span and concentration appropriate. Recent and remote memory intact.  Follows all commands speech and language fluent.  MMSE - Mini Mental State Exam 05/12/2016 10/16/2015 03/26/2015  Orientation to time 5 3 5   Orientation to Place 4 5 5   Registration 3 3 3   Attention/ Calculation 5 5 5   Recall 3 1 0  Language- name 2 objects 2 2 2   Language- repeat 1 1 1   Language- follow 3 step command 3 3 3   Language- read & follow direction 1 1 1   Write a sentence 1 1 1   Copy design 1 1 1   Total score 29 26 27   Clock drawing 4/4. AFT 7.  Cranial nerve II-XII: Pupils were equal round reactive to light extraocular movements were full, visual field were full on confrontational test. Facial sensation and strength were normal. hearing was intact to finger rubbing bilaterally. Uvula tongue midline. head turning and shoulder shrug were normal and symmetric.Tongue protrusion into cheek strength was normal. Motor: normal bulk and tone, full strength in the BUE, BLE, fine finger movements normal, no pronator drift. No focal weakness Sensory: normal and symmetric to light touch, pinprick, and  Vibration, in the upper and lower extremities Coordination: finger-nose-finger, heel-to-shin bilaterally, no dysmetria, no tremor Reflexes: Brachioradialis 2/2, biceps 2/2, triceps 2/2, patellar 2/2, Achilles 2/2, plantar responses were flexor bilaterally. Gait  and Station: Rising up from seated position without assistance, normal stance,  moderate stride, good arm swing, smooth turning, able to perform tiptoe, and heel walking without difficulty. Tandem gait is unsteady, no assistive device  DIAGNOSTIC DATA (LABS, IMAGING, TESTING) -  ASSESSMENT AND PLAN  82 y.o. year old female  has a past medical history of unsteady gait which is multifactorial consistent with her sensory neuro hearing loss/vestibular system malfunction. Cervical spine film shows multilevel degenerative changes no evidence of cervical myelopathy. Mild cognitive impairment without behavior issues complicated by depression. There is a family history of dementia. Patient is not interested in research trial.  PLAN: Continue Exelon patch at current dose Memory exercises such as crosswords, word search etc.  Continue Namenda at current dose  F/U in 6 months Create a safe environment, Reduce confusion, keep familiar objects and people around, stick to a routine Use effective communication such as simple words and short sentences Reduce nighttime restlessness, a consistent nighttime routine,  avoid napping during the day Encourage good nutrition and hydration I spent 25 min  in total face to face time with the patient and daughter more than 50% of which was spent counseling and coordination of care, reviewing test results reviewing medications and discussing and reviewing the diagnosis of mild cognitive impairment and further treatment options. , Rayburn Ma, [facility], APRN  [facility] [address], [place], [place] [address] [phone]

## 2016-05-12 NOTE — Patient Instructions (Signed)
Continue Exelon patch at current dose Memory exercises such as crosswords, word search etc.  Continue Namenda at current dose F/U in 6 months

## 2016-05-13 NOTE — Progress Notes (Signed)
I have reviewed and agreed above plan. 

## 2016-05-21 DIAGNOSIS — E538 Deficiency of other specified B group vitamins: Secondary | ICD-10-CM | POA: Diagnosis not present

## 2016-05-22 DIAGNOSIS — Z933 Colostomy status: Secondary | ICD-10-CM | POA: Diagnosis not present

## 2016-05-25 DIAGNOSIS — H04123 Dry eye syndrome of bilateral lacrimal glands: Secondary | ICD-10-CM | POA: Diagnosis not present

## 2016-05-25 DIAGNOSIS — H35372 Puckering of macula, left eye: Secondary | ICD-10-CM | POA: Diagnosis not present

## 2016-05-28 DIAGNOSIS — E538 Deficiency of other specified B group vitamins: Secondary | ICD-10-CM | POA: Diagnosis not present

## 2016-05-28 DIAGNOSIS — K861 Other chronic pancreatitis: Secondary | ICD-10-CM | POA: Diagnosis not present

## 2016-05-28 DIAGNOSIS — E559 Vitamin D deficiency, unspecified: Secondary | ICD-10-CM | POA: Diagnosis not present

## 2016-05-28 DIAGNOSIS — K591 Functional diarrhea: Secondary | ICD-10-CM | POA: Diagnosis not present

## 2016-05-28 DIAGNOSIS — N189 Chronic kidney disease, unspecified: Secondary | ICD-10-CM | POA: Diagnosis not present

## 2016-05-28 DIAGNOSIS — R5383 Other fatigue: Secondary | ICD-10-CM | POA: Diagnosis not present

## 2016-05-28 DIAGNOSIS — E039 Hypothyroidism, unspecified: Secondary | ICD-10-CM | POA: Diagnosis not present

## 2016-05-29 DIAGNOSIS — N183 Chronic kidney disease, stage 3 (moderate): Secondary | ICD-10-CM | POA: Diagnosis not present

## 2016-05-29 DIAGNOSIS — K219 Gastro-esophageal reflux disease without esophagitis: Secondary | ICD-10-CM | POA: Diagnosis not present

## 2016-05-29 DIAGNOSIS — M329 Systemic lupus erythematosus, unspecified: Secondary | ICD-10-CM | POA: Diagnosis not present

## 2016-05-29 DIAGNOSIS — K861 Other chronic pancreatitis: Secondary | ICD-10-CM | POA: Diagnosis not present

## 2016-05-29 DIAGNOSIS — E039 Hypothyroidism, unspecified: Secondary | ICD-10-CM | POA: Diagnosis not present

## 2016-05-29 DIAGNOSIS — D649 Anemia, unspecified: Secondary | ICD-10-CM | POA: Diagnosis not present

## 2016-05-29 DIAGNOSIS — D6862 Lupus anticoagulant syndrome: Secondary | ICD-10-CM | POA: Diagnosis not present

## 2016-05-29 DIAGNOSIS — Z932 Ileostomy status: Secondary | ICD-10-CM | POA: Diagnosis not present

## 2016-05-29 DIAGNOSIS — Z9049 Acquired absence of other specified parts of digestive tract: Secondary | ICD-10-CM | POA: Diagnosis not present

## 2016-05-29 DIAGNOSIS — K5190 Ulcerative colitis, unspecified, without complications: Secondary | ICD-10-CM | POA: Diagnosis not present

## 2016-06-04 DIAGNOSIS — E538 Deficiency of other specified B group vitamins: Secondary | ICD-10-CM | POA: Diagnosis not present

## 2016-06-08 DIAGNOSIS — Z933 Colostomy status: Secondary | ICD-10-CM | POA: Diagnosis not present

## 2016-06-16 DIAGNOSIS — N189 Chronic kidney disease, unspecified: Secondary | ICD-10-CM | POA: Diagnosis not present

## 2016-06-18 DIAGNOSIS — E538 Deficiency of other specified B group vitamins: Secondary | ICD-10-CM | POA: Diagnosis not present

## 2016-06-22 DIAGNOSIS — Z933 Colostomy status: Secondary | ICD-10-CM | POA: Diagnosis not present

## 2016-06-25 DIAGNOSIS — H10233 Serous conjunctivitis, except viral, bilateral: Secondary | ICD-10-CM | POA: Diagnosis not present

## 2016-07-01 DIAGNOSIS — K8689 Other specified diseases of pancreas: Secondary | ICD-10-CM | POA: Diagnosis not present

## 2016-07-01 DIAGNOSIS — E039 Hypothyroidism, unspecified: Secondary | ICD-10-CM | POA: Diagnosis not present

## 2016-07-01 DIAGNOSIS — N189 Chronic kidney disease, unspecified: Secondary | ICD-10-CM | POA: Diagnosis not present

## 2016-07-01 DIAGNOSIS — R69 Illness, unspecified: Secondary | ICD-10-CM | POA: Diagnosis not present

## 2016-07-01 DIAGNOSIS — G301 Alzheimer's disease with late onset: Secondary | ICD-10-CM | POA: Diagnosis not present

## 2016-07-01 DIAGNOSIS — K219 Gastro-esophageal reflux disease without esophagitis: Secondary | ICD-10-CM | POA: Diagnosis not present

## 2016-07-02 DIAGNOSIS — E538 Deficiency of other specified B group vitamins: Secondary | ICD-10-CM | POA: Diagnosis not present

## 2016-07-03 DIAGNOSIS — Z933 Colostomy status: Secondary | ICD-10-CM | POA: Diagnosis not present

## 2016-07-07 DIAGNOSIS — Z933 Colostomy status: Secondary | ICD-10-CM | POA: Diagnosis not present

## 2016-07-09 DIAGNOSIS — R69 Illness, unspecified: Secondary | ICD-10-CM | POA: Diagnosis not present

## 2016-07-15 DIAGNOSIS — Z933 Colostomy status: Secondary | ICD-10-CM | POA: Diagnosis not present

## 2016-07-16 DIAGNOSIS — N189 Chronic kidney disease, unspecified: Secondary | ICD-10-CM | POA: Diagnosis not present

## 2016-07-16 DIAGNOSIS — E538 Deficiency of other specified B group vitamins: Secondary | ICD-10-CM | POA: Diagnosis not present

## 2016-07-22 DIAGNOSIS — E860 Dehydration: Secondary | ICD-10-CM | POA: Diagnosis not present

## 2016-07-28 DIAGNOSIS — Z933 Colostomy status: Secondary | ICD-10-CM | POA: Diagnosis not present

## 2016-08-03 DIAGNOSIS — R69 Illness, unspecified: Secondary | ICD-10-CM | POA: Diagnosis not present

## 2016-08-07 DIAGNOSIS — Z933 Colostomy status: Secondary | ICD-10-CM | POA: Diagnosis not present

## 2016-08-20 DIAGNOSIS — Z933 Colostomy status: Secondary | ICD-10-CM | POA: Diagnosis not present

## 2016-08-24 DIAGNOSIS — E538 Deficiency of other specified B group vitamins: Secondary | ICD-10-CM | POA: Diagnosis not present

## 2016-08-31 DIAGNOSIS — Z933 Colostomy status: Secondary | ICD-10-CM | POA: Diagnosis not present

## 2016-09-07 DIAGNOSIS — E538 Deficiency of other specified B group vitamins: Secondary | ICD-10-CM | POA: Diagnosis not present

## 2016-09-09 DIAGNOSIS — M24572 Contracture, left ankle: Secondary | ICD-10-CM | POA: Diagnosis not present

## 2016-09-09 DIAGNOSIS — M722 Plantar fascial fibromatosis: Secondary | ICD-10-CM | POA: Diagnosis not present

## 2016-09-10 DIAGNOSIS — Z933 Colostomy status: Secondary | ICD-10-CM | POA: Diagnosis not present

## 2016-09-11 DIAGNOSIS — R0602 Shortness of breath: Secondary | ICD-10-CM | POA: Diagnosis not present

## 2016-09-11 DIAGNOSIS — R110 Nausea: Secondary | ICD-10-CM | POA: Diagnosis not present

## 2016-09-11 DIAGNOSIS — R531 Weakness: Secondary | ICD-10-CM | POA: Diagnosis not present

## 2016-09-11 DIAGNOSIS — Z932 Ileostomy status: Secondary | ICD-10-CM | POA: Diagnosis not present

## 2016-09-11 DIAGNOSIS — E860 Dehydration: Secondary | ICD-10-CM | POA: Diagnosis not present

## 2016-09-21 DIAGNOSIS — H5213 Myopia, bilateral: Secondary | ICD-10-CM | POA: Diagnosis not present

## 2016-09-21 DIAGNOSIS — H04123 Dry eye syndrome of bilateral lacrimal glands: Secondary | ICD-10-CM | POA: Diagnosis not present

## 2016-09-22 DIAGNOSIS — Z933 Colostomy status: Secondary | ICD-10-CM | POA: Diagnosis not present

## 2016-09-22 DIAGNOSIS — E538 Deficiency of other specified B group vitamins: Secondary | ICD-10-CM | POA: Diagnosis not present

## 2016-09-23 DIAGNOSIS — R110 Nausea: Secondary | ICD-10-CM | POA: Diagnosis not present

## 2016-09-23 DIAGNOSIS — K529 Noninfective gastroenteritis and colitis, unspecified: Secondary | ICD-10-CM | POA: Diagnosis not present

## 2016-10-02 DIAGNOSIS — Z933 Colostomy status: Secondary | ICD-10-CM | POA: Diagnosis not present

## 2016-10-06 DIAGNOSIS — E538 Deficiency of other specified B group vitamins: Secondary | ICD-10-CM | POA: Diagnosis not present

## 2016-10-07 DIAGNOSIS — M722 Plantar fascial fibromatosis: Secondary | ICD-10-CM | POA: Diagnosis not present

## 2016-10-07 DIAGNOSIS — M24572 Contracture, left ankle: Secondary | ICD-10-CM | POA: Diagnosis not present

## 2016-10-12 DIAGNOSIS — R69 Illness, unspecified: Secondary | ICD-10-CM | POA: Diagnosis not present

## 2016-10-15 DIAGNOSIS — E860 Dehydration: Secondary | ICD-10-CM | POA: Diagnosis not present

## 2016-10-20 DIAGNOSIS — E538 Deficiency of other specified B group vitamins: Secondary | ICD-10-CM | POA: Diagnosis not present

## 2016-10-26 DIAGNOSIS — Z933 Colostomy status: Secondary | ICD-10-CM | POA: Diagnosis not present

## 2016-11-03 DIAGNOSIS — E538 Deficiency of other specified B group vitamins: Secondary | ICD-10-CM | POA: Diagnosis not present

## 2016-11-05 ENCOUNTER — Other Ambulatory Visit: Payer: Self-pay | Admitting: Neurology

## 2016-11-05 DIAGNOSIS — K591 Functional diarrhea: Secondary | ICD-10-CM | POA: Diagnosis not present

## 2016-11-05 DIAGNOSIS — E538 Deficiency of other specified B group vitamins: Secondary | ICD-10-CM | POA: Diagnosis not present

## 2016-11-05 DIAGNOSIS — E559 Vitamin D deficiency, unspecified: Secondary | ICD-10-CM | POA: Diagnosis not present

## 2016-11-05 DIAGNOSIS — R5383 Other fatigue: Secondary | ICD-10-CM | POA: Diagnosis not present

## 2016-11-05 DIAGNOSIS — N189 Chronic kidney disease, unspecified: Secondary | ICD-10-CM | POA: Diagnosis not present

## 2016-11-05 DIAGNOSIS — M722 Plantar fascial fibromatosis: Secondary | ICD-10-CM | POA: Diagnosis not present

## 2016-11-05 DIAGNOSIS — E039 Hypothyroidism, unspecified: Secondary | ICD-10-CM | POA: Diagnosis not present

## 2016-11-06 DIAGNOSIS — Z933 Colostomy status: Secondary | ICD-10-CM | POA: Diagnosis not present

## 2016-11-16 ENCOUNTER — Ambulatory Visit: Payer: Medicare HMO | Admitting: Nurse Practitioner

## 2016-11-18 DIAGNOSIS — Z933 Colostomy status: Secondary | ICD-10-CM | POA: Diagnosis not present

## 2016-11-30 DIAGNOSIS — Z933 Colostomy status: Secondary | ICD-10-CM | POA: Diagnosis not present

## 2016-12-03 DIAGNOSIS — E538 Deficiency of other specified B group vitamins: Secondary | ICD-10-CM | POA: Diagnosis not present

## 2016-12-08 DIAGNOSIS — E860 Dehydration: Secondary | ICD-10-CM | POA: Diagnosis not present

## 2016-12-10 DIAGNOSIS — Z933 Colostomy status: Secondary | ICD-10-CM | POA: Diagnosis not present

## 2016-12-17 DIAGNOSIS — E538 Deficiency of other specified B group vitamins: Secondary | ICD-10-CM | POA: Diagnosis not present

## 2016-12-18 DIAGNOSIS — Z933 Colostomy status: Secondary | ICD-10-CM | POA: Diagnosis not present

## 2016-12-31 DIAGNOSIS — E538 Deficiency of other specified B group vitamins: Secondary | ICD-10-CM | POA: Diagnosis not present

## 2016-12-31 DIAGNOSIS — Z933 Colostomy status: Secondary | ICD-10-CM | POA: Diagnosis not present

## 2017-01-06 DIAGNOSIS — R69 Illness, unspecified: Secondary | ICD-10-CM | POA: Diagnosis not present

## 2017-01-14 DIAGNOSIS — Z933 Colostomy status: Secondary | ICD-10-CM | POA: Diagnosis not present

## 2017-01-17 DIAGNOSIS — Z79899 Other long term (current) drug therapy: Secondary | ICD-10-CM | POA: Diagnosis not present

## 2017-01-17 DIAGNOSIS — N183 Chronic kidney disease, stage 3 (moderate): Secondary | ICD-10-CM | POA: Diagnosis not present

## 2017-01-17 DIAGNOSIS — R42 Dizziness and giddiness: Secondary | ICD-10-CM | POA: Diagnosis not present

## 2017-01-17 DIAGNOSIS — R0602 Shortness of breath: Secondary | ICD-10-CM | POA: Diagnosis not present

## 2017-01-17 DIAGNOSIS — E860 Dehydration: Secondary | ICD-10-CM | POA: Diagnosis not present

## 2017-01-17 DIAGNOSIS — R531 Weakness: Secondary | ICD-10-CM | POA: Diagnosis not present

## 2017-01-17 DIAGNOSIS — R69 Illness, unspecified: Secondary | ICD-10-CM | POA: Diagnosis not present

## 2017-01-26 DIAGNOSIS — Z933 Colostomy status: Secondary | ICD-10-CM | POA: Diagnosis not present

## 2017-01-27 DIAGNOSIS — R630 Anorexia: Secondary | ICD-10-CM | POA: Diagnosis not present

## 2017-01-27 DIAGNOSIS — E860 Dehydration: Secondary | ICD-10-CM | POA: Diagnosis not present

## 2017-01-29 DIAGNOSIS — E860 Dehydration: Secondary | ICD-10-CM | POA: Diagnosis not present

## 2017-02-01 DIAGNOSIS — Z933 Colostomy status: Secondary | ICD-10-CM | POA: Diagnosis not present

## 2017-02-01 DIAGNOSIS — E538 Deficiency of other specified B group vitamins: Secondary | ICD-10-CM | POA: Diagnosis not present

## 2017-02-04 DIAGNOSIS — R531 Weakness: Secondary | ICD-10-CM | POA: Diagnosis not present

## 2017-02-04 DIAGNOSIS — I493 Ventricular premature depolarization: Secondary | ICD-10-CM | POA: Diagnosis not present

## 2017-02-04 DIAGNOSIS — M791 Myalgia, unspecified site: Secondary | ICD-10-CM | POA: Diagnosis not present

## 2017-02-04 DIAGNOSIS — K591 Functional diarrhea: Secondary | ICD-10-CM | POA: Diagnosis not present

## 2017-02-04 DIAGNOSIS — R0602 Shortness of breath: Secondary | ICD-10-CM | POA: Diagnosis not present

## 2017-02-04 DIAGNOSIS — E559 Vitamin D deficiency, unspecified: Secondary | ICD-10-CM | POA: Diagnosis not present

## 2017-02-04 DIAGNOSIS — E039 Hypothyroidism, unspecified: Secondary | ICD-10-CM | POA: Diagnosis not present

## 2017-02-04 DIAGNOSIS — R5383 Other fatigue: Secondary | ICD-10-CM | POA: Diagnosis not present

## 2017-02-04 DIAGNOSIS — R1084 Generalized abdominal pain: Secondary | ICD-10-CM | POA: Diagnosis not present

## 2017-02-04 DIAGNOSIS — N189 Chronic kidney disease, unspecified: Secondary | ICD-10-CM | POA: Diagnosis not present

## 2017-02-12 DIAGNOSIS — Z933 Colostomy status: Secondary | ICD-10-CM | POA: Diagnosis not present

## 2017-02-15 DIAGNOSIS — E538 Deficiency of other specified B group vitamins: Secondary | ICD-10-CM | POA: Diagnosis not present

## 2017-02-24 DIAGNOSIS — Z933 Colostomy status: Secondary | ICD-10-CM | POA: Diagnosis not present

## 2017-02-25 DIAGNOSIS — I493 Ventricular premature depolarization: Secondary | ICD-10-CM | POA: Diagnosis not present

## 2017-03-01 DIAGNOSIS — E538 Deficiency of other specified B group vitamins: Secondary | ICD-10-CM | POA: Diagnosis not present

## 2017-03-04 DIAGNOSIS — N183 Chronic kidney disease, stage 3 (moderate): Secondary | ICD-10-CM | POA: Diagnosis not present

## 2017-03-04 DIAGNOSIS — E559 Vitamin D deficiency, unspecified: Secondary | ICD-10-CM | POA: Diagnosis not present

## 2017-03-05 DIAGNOSIS — E039 Hypothyroidism, unspecified: Secondary | ICD-10-CM | POA: Diagnosis not present

## 2017-03-05 DIAGNOSIS — N183 Chronic kidney disease, stage 3 (moderate): Secondary | ICD-10-CM | POA: Diagnosis not present

## 2017-03-05 DIAGNOSIS — K591 Functional diarrhea: Secondary | ICD-10-CM | POA: Diagnosis not present

## 2017-03-05 DIAGNOSIS — K219 Gastro-esophageal reflux disease without esophagitis: Secondary | ICD-10-CM | POA: Diagnosis not present

## 2017-03-05 DIAGNOSIS — R630 Anorexia: Secondary | ICD-10-CM | POA: Diagnosis not present

## 2017-03-05 DIAGNOSIS — K861 Other chronic pancreatitis: Secondary | ICD-10-CM | POA: Diagnosis not present

## 2017-03-08 DIAGNOSIS — Z933 Colostomy status: Secondary | ICD-10-CM | POA: Diagnosis not present

## 2017-03-09 DIAGNOSIS — Z933 Colostomy status: Secondary | ICD-10-CM | POA: Diagnosis not present

## 2017-03-11 DIAGNOSIS — I493 Ventricular premature depolarization: Secondary | ICD-10-CM | POA: Diagnosis not present

## 2017-03-15 DIAGNOSIS — N183 Chronic kidney disease, stage 3 (moderate): Secondary | ICD-10-CM | POA: Diagnosis not present

## 2017-03-15 DIAGNOSIS — E860 Dehydration: Secondary | ICD-10-CM | POA: Diagnosis not present

## 2017-03-15 DIAGNOSIS — K591 Functional diarrhea: Secondary | ICD-10-CM | POA: Diagnosis not present

## 2017-03-16 DIAGNOSIS — E538 Deficiency of other specified B group vitamins: Secondary | ICD-10-CM | POA: Diagnosis not present

## 2017-03-22 DIAGNOSIS — R69 Illness, unspecified: Secondary | ICD-10-CM | POA: Diagnosis not present

## 2017-03-29 DIAGNOSIS — H04123 Dry eye syndrome of bilateral lacrimal glands: Secondary | ICD-10-CM | POA: Diagnosis not present

## 2017-03-30 DIAGNOSIS — E538 Deficiency of other specified B group vitamins: Secondary | ICD-10-CM | POA: Diagnosis not present

## 2017-04-01 DIAGNOSIS — Z933 Colostomy status: Secondary | ICD-10-CM | POA: Diagnosis not present

## 2017-04-08 DIAGNOSIS — N183 Chronic kidney disease, stage 3 (moderate): Secondary | ICD-10-CM | POA: Diagnosis not present

## 2017-04-08 DIAGNOSIS — E860 Dehydration: Secondary | ICD-10-CM | POA: Diagnosis not present

## 2017-04-08 DIAGNOSIS — K591 Functional diarrhea: Secondary | ICD-10-CM | POA: Diagnosis not present

## 2017-04-13 DIAGNOSIS — E538 Deficiency of other specified B group vitamins: Secondary | ICD-10-CM | POA: Diagnosis not present

## 2017-04-15 DIAGNOSIS — Z933 Colostomy status: Secondary | ICD-10-CM | POA: Diagnosis not present

## 2017-04-19 ENCOUNTER — Ambulatory Visit: Payer: Medicare HMO | Admitting: Nurse Practitioner

## 2017-04-19 DIAGNOSIS — K591 Functional diarrhea: Secondary | ICD-10-CM | POA: Diagnosis not present

## 2017-04-19 DIAGNOSIS — N183 Chronic kidney disease, stage 3 (moderate): Secondary | ICD-10-CM | POA: Diagnosis not present

## 2017-04-19 DIAGNOSIS — E860 Dehydration: Secondary | ICD-10-CM | POA: Diagnosis not present

## 2017-04-26 DIAGNOSIS — I493 Ventricular premature depolarization: Secondary | ICD-10-CM | POA: Diagnosis not present

## 2017-04-27 DIAGNOSIS — E538 Deficiency of other specified B group vitamins: Secondary | ICD-10-CM | POA: Diagnosis not present

## 2017-04-28 DIAGNOSIS — Z933 Colostomy status: Secondary | ICD-10-CM | POA: Diagnosis not present

## 2017-05-10 ENCOUNTER — Ambulatory Visit: Payer: Medicare HMO | Admitting: Neurology

## 2017-05-11 DIAGNOSIS — E538 Deficiency of other specified B group vitamins: Secondary | ICD-10-CM | POA: Diagnosis not present

## 2017-05-12 DIAGNOSIS — Z933 Colostomy status: Secondary | ICD-10-CM | POA: Diagnosis not present

## 2017-05-17 ENCOUNTER — Telehealth: Payer: Self-pay | Admitting: *Deleted

## 2017-05-17 ENCOUNTER — Ambulatory Visit: Payer: Medicare HMO | Admitting: Neurology

## 2017-05-17 NOTE — Telephone Encounter (Signed)
No show - canceled one hour prior to appt (transporatation issues).

## 2017-05-18 ENCOUNTER — Encounter: Payer: Self-pay | Admitting: Neurology

## 2017-05-24 DIAGNOSIS — H5213 Myopia, bilateral: Secondary | ICD-10-CM | POA: Diagnosis not present

## 2017-05-24 DIAGNOSIS — Z933 Colostomy status: Secondary | ICD-10-CM | POA: Diagnosis not present

## 2017-05-24 DIAGNOSIS — H47022 Hemorrhage in optic nerve sheath, left eye: Secondary | ICD-10-CM | POA: Diagnosis not present

## 2017-05-25 DIAGNOSIS — E860 Dehydration: Secondary | ICD-10-CM | POA: Diagnosis not present

## 2017-05-25 DIAGNOSIS — N183 Chronic kidney disease, stage 3 (moderate): Secondary | ICD-10-CM | POA: Diagnosis not present

## 2017-05-25 DIAGNOSIS — K591 Functional diarrhea: Secondary | ICD-10-CM | POA: Diagnosis not present

## 2017-05-26 DIAGNOSIS — E538 Deficiency of other specified B group vitamins: Secondary | ICD-10-CM | POA: Diagnosis not present

## 2017-06-04 DIAGNOSIS — Z933 Colostomy status: Secondary | ICD-10-CM | POA: Diagnosis not present

## 2017-06-09 DIAGNOSIS — E538 Deficiency of other specified B group vitamins: Secondary | ICD-10-CM | POA: Diagnosis not present

## 2017-06-14 ENCOUNTER — Other Ambulatory Visit: Payer: Self-pay | Admitting: Neurology

## 2017-06-18 DIAGNOSIS — Z933 Colostomy status: Secondary | ICD-10-CM | POA: Diagnosis not present

## 2017-06-23 DIAGNOSIS — Z933 Colostomy status: Secondary | ICD-10-CM | POA: Diagnosis not present

## 2017-06-25 DIAGNOSIS — R5383 Other fatigue: Secondary | ICD-10-CM | POA: Diagnosis not present

## 2017-06-25 DIAGNOSIS — N183 Chronic kidney disease, stage 3 (moderate): Secondary | ICD-10-CM | POA: Diagnosis not present

## 2017-06-25 DIAGNOSIS — E039 Hypothyroidism, unspecified: Secondary | ICD-10-CM | POA: Diagnosis not present

## 2017-06-25 DIAGNOSIS — K591 Functional diarrhea: Secondary | ICD-10-CM | POA: Diagnosis not present

## 2017-06-25 DIAGNOSIS — K861 Other chronic pancreatitis: Secondary | ICD-10-CM | POA: Diagnosis not present

## 2017-06-25 DIAGNOSIS — G4700 Insomnia, unspecified: Secondary | ICD-10-CM | POA: Diagnosis not present

## 2017-06-25 DIAGNOSIS — K219 Gastro-esophageal reflux disease without esophagitis: Secondary | ICD-10-CM | POA: Diagnosis not present

## 2017-06-25 DIAGNOSIS — R69 Illness, unspecified: Secondary | ICD-10-CM | POA: Diagnosis not present

## 2017-06-29 DIAGNOSIS — D631 Anemia in chronic kidney disease: Secondary | ICD-10-CM | POA: Diagnosis not present

## 2017-06-29 DIAGNOSIS — N183 Chronic kidney disease, stage 3 (moderate): Secondary | ICD-10-CM | POA: Diagnosis not present

## 2017-06-29 DIAGNOSIS — N2581 Secondary hyperparathyroidism of renal origin: Secondary | ICD-10-CM | POA: Diagnosis not present

## 2017-06-30 DIAGNOSIS — K591 Functional diarrhea: Secondary | ICD-10-CM | POA: Diagnosis not present

## 2017-06-30 DIAGNOSIS — E860 Dehydration: Secondary | ICD-10-CM | POA: Diagnosis not present

## 2017-06-30 DIAGNOSIS — N183 Chronic kidney disease, stage 3 (moderate): Secondary | ICD-10-CM | POA: Diagnosis not present

## 2017-07-01 DIAGNOSIS — Z933 Colostomy status: Secondary | ICD-10-CM | POA: Diagnosis not present

## 2017-07-05 DIAGNOSIS — E538 Deficiency of other specified B group vitamins: Secondary | ICD-10-CM | POA: Diagnosis not present

## 2017-07-08 DIAGNOSIS — E559 Vitamin D deficiency, unspecified: Secondary | ICD-10-CM | POA: Diagnosis not present

## 2017-07-08 DIAGNOSIS — E039 Hypothyroidism, unspecified: Secondary | ICD-10-CM | POA: Diagnosis not present

## 2017-07-08 DIAGNOSIS — R5383 Other fatigue: Secondary | ICD-10-CM | POA: Diagnosis not present

## 2017-07-08 DIAGNOSIS — Z79899 Other long term (current) drug therapy: Secondary | ICD-10-CM | POA: Diagnosis not present

## 2017-07-08 DIAGNOSIS — N183 Chronic kidney disease, stage 3 (moderate): Secondary | ICD-10-CM | POA: Diagnosis not present

## 2017-07-08 DIAGNOSIS — K219 Gastro-esophageal reflux disease without esophagitis: Secondary | ICD-10-CM | POA: Diagnosis not present

## 2017-07-14 DIAGNOSIS — Z933 Colostomy status: Secondary | ICD-10-CM | POA: Diagnosis not present

## 2017-07-21 DIAGNOSIS — E538 Deficiency of other specified B group vitamins: Secondary | ICD-10-CM | POA: Diagnosis not present

## 2017-07-27 DIAGNOSIS — Z933 Colostomy status: Secondary | ICD-10-CM | POA: Diagnosis not present

## 2017-08-04 DIAGNOSIS — N183 Chronic kidney disease, stage 3 (moderate): Secondary | ICD-10-CM | POA: Diagnosis not present

## 2017-08-04 DIAGNOSIS — K591 Functional diarrhea: Secondary | ICD-10-CM | POA: Diagnosis not present

## 2017-08-04 DIAGNOSIS — E860 Dehydration: Secondary | ICD-10-CM | POA: Diagnosis not present

## 2017-08-05 DIAGNOSIS — E538 Deficiency of other specified B group vitamins: Secondary | ICD-10-CM | POA: Diagnosis not present

## 2017-08-09 DIAGNOSIS — Z933 Colostomy status: Secondary | ICD-10-CM | POA: Diagnosis not present

## 2017-08-10 ENCOUNTER — Ambulatory Visit: Payer: Medicare HMO | Admitting: Neurology

## 2017-08-10 ENCOUNTER — Other Ambulatory Visit: Payer: Self-pay

## 2017-08-10 ENCOUNTER — Encounter: Payer: Self-pay | Admitting: Neurology

## 2017-08-10 VITALS — BP 130/65 | HR 81 | Resp 16 | Ht 64.0 in | Wt 131.0 lb

## 2017-08-10 DIAGNOSIS — G3184 Mild cognitive impairment, so stated: Secondary | ICD-10-CM

## 2017-08-10 NOTE — Progress Notes (Signed)
[facility]  PATIENT: Bonnie Butler DOB: 03/24/1934   HISTORY OF PRESENT ILLNESS: Bonnie Butler is a 83 years old right-handed female, accompanied by her daughter [name], seen in refer by her primary care physician Dr. [name] in March 26 2015 for evaluation of memory loss, unsteady gait tendency to lean backwards.  She had a history of ulcerative colitis, had a history of ileostomy since 1978,  history of progressive hearing loss had right cochlear implantation, still has poor hearing, wearing left hearing aid, long-term Coumadin for positive lupus anticoagulants, vitamin B12 deficiency, hypothyroidism, on supplement,  I have reviewed and summarized her previous neurological record from Dr. [name] in 2006, she was evaluated for similar complaints of progressive memory loss, per record, MRI of the brain in 2006 showed nonspecific hemisphere white matter changes  She graduates from high school, used to work as a International aid/development worker job, she was disabled since 1980s, she lives at her house of [place] acres, lives alone, manage her own household, still driving short distances, has no significant difficulty managing her bills, her sister at age 73, has mild memory trouble, she also has significant depression anxiety symptoms. She has been treated with Exelon patch 9.6 milligrams daily  She was noted to have slow worsening memory trouble tends to repeat herself, no longer cooking regularly, difficulty to keep up with her grandchildren's birthday, she also has gradual onset gait difficulty over the past few years, hold on furniture to walk, tendency to fell backwards, dizziness spinning sensation sometimes  We have reviewed her CAT scan in 2017, there was right cochlear implant, generalized atrophy, especially superior cerebellum atrophy, no acute lesions.  Update April 10 2015:Overall, her gait has much improved, she only take meclizine as needed for  dizziness, complains of drowsiness with medications, We have reviewed the film of CAT scan of cervical spine in April 01 2015 from [facility] radiology at [facility], there is broad-based disc osteophyte complex and uncovertebral spurring at C6-7 with moderate foraminal stenosis, left greater than right, moderate foraminal stenosis is also present at T1-2, worse on the left, shallow disc protrusion at C2-3, C3-4 without significant stenosis, mild left foraminal narrowing at C4-5, asymmetric left-sided facet hypertrophy at C4-5, C5-6, disease progressed since 2011 including C6 and 7  She was taking Namenda 10 mg daily instead of twice a day, tolerating medication well, daughter reported slow worsening memory trouble, making mistakes on her bill, more confused about her medications  UPDATE August 16th 2017: She is with her daughter at today's clinical visit, continue lives alone, she still drives, she drive 30 miles to visit her sister who suffered dementia, stated nursing home, her gait has improved with physical therapy she denies significant pain,  She had neuropsychiatric evaluation by Dr. Valentina Shaggy in July 2017, went over findings with patient and her daughter in detail, below normal range, she demonstrate impairments at visual memory, visual constructive scale, executive ability, abstract reasoning/problem solving, testing represent a significant decline in her cognitive function, compared to previous baseline in 2011, suggested diagnosis of mild cognitive impairment versus mild dementia,  UPDATE August 10 2017: She is accompanied by her daughter at today's clinical visit, who is visiting her from [place] she has worsening memory loss, tends to repeat qustions, she is reluctant to reach out for help, per daughter, she has missed some of her medications, she still drives short distance, her son lives just 10 minutes away from her Mini-Mental Status Examination is 27/30 today, with  10 animal  naming.   REVIEW OF SYSTEMS: Full 14 system review of systems performed and notable only for those listed, all others are neg:  As above   ALLERGIES: Allergies  Allergen Reactions  . Penicillins Rash  . Sulfa Antibiotics Rash    Other Reaction: Other reaction  . Macrobid [Nitrofurantoin] Rash  . Rocephin [Ceftriaxone Sodium In Dextrose] Rash  . Tape Rash    HOME MEDICATIONS: Outpatient Medications Prior to Visit  Medication Sig Dispense Refill  . ALPRAZolam (XANAX) 0.5 MG tablet Take 0.5 mg by mouth 2 (two) times daily as needed.    . cyanocobalamin (,VITAMIN B-12,) 1000 MCG/ML injection Inject into the muscle every 30 (thirty) days.    . diphenoxylate-atropine (LOMOTIL) 2.5-0.025 MG tablet Take by mouth as needed.    Marland Kitchen escitalopram (LEXAPRO) 20 MG tablet Take 20 mg by mouth daily.  5  . hypromellose (GENTEAL SEVERE) 0.3 % GEL ophthalmic ointment Place into both eyes as needed.    . lansoprazole (PREVACID) 30 MG capsule Take 30 mg by mouth daily at 12 noon.    Marland Kitchen levothyroxine (SYNTHROID, LEVOTHROID) 50 MCG tablet Take 50 mcg by mouth daily.  11  . lipase/protease/amylase (CREON) 12000 units CPEP capsule Take 3,600 Units by mouth.    . loperamide (IMODIUM) 2 MG capsule Take 4 mg by mouth as needed.    . memantine (NAMENDA) 10 MG tablet TAKE 1 TABLET (10 MG TOTAL) BY MOUTH 2 (TWO) TIMES DAILY. 180 tablet 0  . ondansetron (ZOFRAN) 4 MG tablet Take 4 mg by mouth as needed.    . rivastigmine (EXELON) 9.5 mg/24hr daily.    . traMADol (ULTRAM) 50 MG tablet Take 50 mg by mouth every 6 (six) hours as needed. for pain  5  . Pancrelipase, Lip-Prot-Amyl, (CREON) 24000-76000 units CPEP TAKE 1 CAPSULE BY MOUTH 3 TIMES A DAY WITH MEAL     No facility-administered medications prior to visit.     PAST MEDICAL HISTORY: Past Medical History:  Diagnosis Date  . Anxiety   . Broken upper arm    right  . Chronic renal disease, stage 3, moderately decreased glomerular filtration rate between  30-59 mL/min/1.73 square meter   . DDD (degenerative disc disease), cervical   . Dementia   . Depression   . Dry eyes   . GERD (gastroesophageal reflux disease)   . H/O ileostomy   . Hearing loss   . Hypothyroidism   . Low protein C activity level (HCC)   . Memory loss   . Osteoarthritis   . Renal cyst   . Short gut syndrome   . Ulcerative colitis ([place])   . Vertigo     PAST SURGICAL HISTORY: Past Surgical History:  Procedure Laterality Date  . APPENDECTOMY    . CATARACT EXTRACTION     bilateral  . COCHLEAR IMPLANT     right  . colectomy    . ILEOSTOMY    . VAGINAL HYSTERECTOMY      FAMILY HISTORY: Family History  Problem Relation Age of Onset  . Congestive Heart Failure Mother   . Kidney disease Father   . Lung cancer Brother   . Stroke Sister   . COPD Sister     SOCIAL HISTORY: Social History   Socioeconomic History  . Marital status: Widowed    Spouse name: Not on file  . Number of children: 3  . Years of education: 41  . Highest education level: Not on file  Occupational History  .  Occupation: Retired  Scientific laboratory technician  . Financial resource strain: Not on file  . Food insecurity:    Worry: Not on file    Inability: Not on file  . Transportation needs:    Medical: Not on file    Non-medical: Not on file  Tobacco Use  . Smoking status: Never Smoker  . Smokeless tobacco: Never Used  Substance and Sexual Activity  . Alcohol use: No    Alcohol/week: 0.0 oz  . Drug use: No  . Sexual activity: Not on file  Lifestyle  . Physical activity:    Days per week: Not on file    Minutes per session: Not on file  . Stress: Not on file  Relationships  . Social connections:    Talks on phone: Not on file    Gets together: Not on file    Attends religious service: Not on file    Active member of club or organization: Not on file    Attends meetings of clubs or organizations: Not on file    Relationship status: Not on file  . Intimate partner violence:     Fear of current or ex partner: Not on file    Emotionally abused: Not on file    Physically abused: Not on file    Forced sexual activity: Not on file  Other Topics Concern  . Not on file  Social History Narrative   Lives at home alone.   Right-handed.   Occasional caffeine use.     PHYSICAL EXAM  There were no vitals filed for this visit. There is no height or weight on file to calculate BMI.  Generalized: Well developed, in no acute distress  Head: normocephalic and atraumatic,. Oropharynx benign  Neck: Supple, no carotid bruits  Cardiac: Regular rate rhythm, no murmur  Musculoskeletal: No deformity   Neurological examination   Mentation: Alert oriented to time, place, history taking. Attention span and concentration appropriate. Recent and remote memory intact.  Follows all commands speech and language fluent.  MMSE - Mini Mental State Exam 05/12/2016 10/16/2015 03/26/2015  Orientation to time 5 3 5   Orientation to Place 4 5 5   Registration 3 3 3   Attention/ Calculation 5 5 5   Recall 3 1 0  Language- name 2 objects 2 2 2   Language- repeat 1 1 1   Language- follow 3 step command 3 3 3   Language- read & follow direction 1 1 1   Write a sentence 1 1 1   Copy design 1 1 1   Total score 29 26 27    .  Cranial nerve II-XII: Pupils were equal round reactive to light extraocular movements were full, visual field were full on confrontational test. Facial sensation and strength were normal. hearing was intact to finger rubbing bilaterally. Uvula tongue midline. head turning and shoulder shrug were normal and symmetric.Tongue protrusion into cheek strength was normal. Motor: normal bulk and tone, full strength in the BUE, BLE, fine finger movements normal, no pronator drift. No focal weakness Sensory: normal and symmetric to light touch, pinprick, and  Vibration, in the upper and lower extremities Coordination: finger-nose-finger, heel-to-shin bilaterally, no dysmetria, no  tremor Reflexes: Brachioradialis 2/2, biceps 2/2, triceps 2/2, patellar 2/2, Achilles 2/2, plantar responses were flexor bilaterally. Gait and Station: Rising up from seated position without assistance, normal stance   DIAGNOSTIC DATA (LABS, IMAGING, TESTING) -  ASSESSMENT AND PLAN  83 y.o. year old female   Mild cognitive impairment  Slow worsening over time, to the point of  affecting her daily function  Continue with Namenda 10 mg twice a day  Exelon patch 9.5 mg every 24 hours, could not tolerate Aricept in the past due to GI side effect  Marcial Pacas, M.D. Ph.D.  [facility] [place], [place] [address] Phone: [phone] Fax:      [phone]

## 2017-08-11 DIAGNOSIS — R1013 Epigastric pain: Secondary | ICD-10-CM | POA: Diagnosis not present

## 2017-08-11 DIAGNOSIS — K861 Other chronic pancreatitis: Secondary | ICD-10-CM | POA: Diagnosis not present

## 2017-08-11 DIAGNOSIS — R1310 Dysphagia, unspecified: Secondary | ICD-10-CM | POA: Diagnosis not present

## 2017-08-19 DIAGNOSIS — E538 Deficiency of other specified B group vitamins: Secondary | ICD-10-CM | POA: Diagnosis not present

## 2017-08-23 DIAGNOSIS — Z933 Colostomy status: Secondary | ICD-10-CM | POA: Diagnosis not present

## 2017-08-24 DIAGNOSIS — N201 Calculus of ureter: Secondary | ICD-10-CM | POA: Diagnosis not present

## 2017-08-24 DIAGNOSIS — E871 Hypo-osmolality and hyponatremia: Secondary | ICD-10-CM | POA: Diagnosis not present

## 2017-08-24 DIAGNOSIS — E860 Dehydration: Secondary | ICD-10-CM | POA: Diagnosis not present

## 2017-09-01 DIAGNOSIS — Z933 Colostomy status: Secondary | ICD-10-CM | POA: Diagnosis not present

## 2017-09-06 DIAGNOSIS — E538 Deficiency of other specified B group vitamins: Secondary | ICD-10-CM | POA: Diagnosis not present

## 2017-09-08 DIAGNOSIS — E860 Dehydration: Secondary | ICD-10-CM | POA: Diagnosis not present

## 2017-09-08 DIAGNOSIS — N183 Chronic kidney disease, stage 3 (moderate): Secondary | ICD-10-CM | POA: Diagnosis not present

## 2017-09-08 DIAGNOSIS — K591 Functional diarrhea: Secondary | ICD-10-CM | POA: Diagnosis not present

## 2017-09-18 DIAGNOSIS — Z933 Colostomy status: Secondary | ICD-10-CM | POA: Diagnosis not present

## 2017-09-20 DIAGNOSIS — E538 Deficiency of other specified B group vitamins: Secondary | ICD-10-CM | POA: Diagnosis not present

## 2017-09-24 ENCOUNTER — Other Ambulatory Visit: Payer: Self-pay | Admitting: Neurology

## 2017-09-27 DIAGNOSIS — Z933 Colostomy status: Secondary | ICD-10-CM | POA: Diagnosis not present

## 2017-10-04 DIAGNOSIS — E538 Deficiency of other specified B group vitamins: Secondary | ICD-10-CM | POA: Diagnosis not present

## 2017-10-08 DIAGNOSIS — E860 Dehydration: Secondary | ICD-10-CM | POA: Diagnosis not present

## 2017-10-08 DIAGNOSIS — Z9049 Acquired absence of other specified parts of digestive tract: Secondary | ICD-10-CM | POA: Diagnosis not present

## 2017-10-08 DIAGNOSIS — Z932 Ileostomy status: Secondary | ICD-10-CM | POA: Diagnosis not present

## 2017-10-09 DIAGNOSIS — E860 Dehydration: Secondary | ICD-10-CM | POA: Diagnosis not present

## 2017-10-09 DIAGNOSIS — Z932 Ileostomy status: Secondary | ICD-10-CM | POA: Diagnosis not present

## 2017-10-09 DIAGNOSIS — Z9049 Acquired absence of other specified parts of digestive tract: Secondary | ICD-10-CM | POA: Diagnosis not present

## 2017-10-11 DIAGNOSIS — Z933 Colostomy status: Secondary | ICD-10-CM | POA: Diagnosis not present

## 2017-10-13 DIAGNOSIS — N183 Chronic kidney disease, stage 3 (moderate): Secondary | ICD-10-CM | POA: Diagnosis not present

## 2017-10-13 DIAGNOSIS — K591 Functional diarrhea: Secondary | ICD-10-CM | POA: Diagnosis not present

## 2017-10-13 DIAGNOSIS — E860 Dehydration: Secondary | ICD-10-CM | POA: Diagnosis not present

## 2017-10-14 DIAGNOSIS — E860 Dehydration: Secondary | ICD-10-CM | POA: Diagnosis not present

## 2017-10-19 DIAGNOSIS — E538 Deficiency of other specified B group vitamins: Secondary | ICD-10-CM | POA: Diagnosis not present

## 2017-10-20 DIAGNOSIS — Z933 Colostomy status: Secondary | ICD-10-CM | POA: Diagnosis not present

## 2017-10-27 DIAGNOSIS — E039 Hypothyroidism, unspecified: Secondary | ICD-10-CM | POA: Diagnosis not present

## 2017-10-27 DIAGNOSIS — M25472 Effusion, left ankle: Secondary | ICD-10-CM | POA: Diagnosis not present

## 2017-10-27 DIAGNOSIS — M25471 Effusion, right ankle: Secondary | ICD-10-CM | POA: Diagnosis not present

## 2017-10-27 DIAGNOSIS — K861 Other chronic pancreatitis: Secondary | ICD-10-CM | POA: Diagnosis not present

## 2017-10-27 DIAGNOSIS — N183 Chronic kidney disease, stage 3 (moderate): Secondary | ICD-10-CM | POA: Diagnosis not present

## 2017-10-27 DIAGNOSIS — Z79899 Other long term (current) drug therapy: Secondary | ICD-10-CM | POA: Diagnosis not present

## 2017-10-27 DIAGNOSIS — E559 Vitamin D deficiency, unspecified: Secondary | ICD-10-CM | POA: Diagnosis not present

## 2017-10-27 DIAGNOSIS — R69 Illness, unspecified: Secondary | ICD-10-CM | POA: Diagnosis not present

## 2017-10-27 DIAGNOSIS — K219 Gastro-esophageal reflux disease without esophagitis: Secondary | ICD-10-CM | POA: Diagnosis not present

## 2017-11-01 DIAGNOSIS — Z79899 Other long term (current) drug therapy: Secondary | ICD-10-CM | POA: Diagnosis not present

## 2017-11-01 DIAGNOSIS — R69 Illness, unspecified: Secondary | ICD-10-CM | POA: Diagnosis not present

## 2017-11-01 DIAGNOSIS — E860 Dehydration: Secondary | ICD-10-CM | POA: Diagnosis not present

## 2017-11-01 DIAGNOSIS — B9689 Other specified bacterial agents as the cause of diseases classified elsewhere: Secondary | ICD-10-CM | POA: Diagnosis not present

## 2017-11-01 DIAGNOSIS — E039 Hypothyroidism, unspecified: Secondary | ICD-10-CM | POA: Diagnosis not present

## 2017-11-01 DIAGNOSIS — N183 Chronic kidney disease, stage 3 (moderate): Secondary | ICD-10-CM | POA: Diagnosis not present

## 2017-11-01 DIAGNOSIS — N3000 Acute cystitis without hematuria: Secondary | ICD-10-CM | POA: Diagnosis not present

## 2017-11-01 DIAGNOSIS — K439 Ventral hernia without obstruction or gangrene: Secondary | ICD-10-CM | POA: Diagnosis not present

## 2017-11-01 DIAGNOSIS — R9431 Abnormal electrocardiogram [ECG] [EKG]: Secondary | ICD-10-CM | POA: Diagnosis not present

## 2017-11-02 DIAGNOSIS — Z6823 Body mass index (BMI) 23.0-23.9, adult: Secondary | ICD-10-CM | POA: Diagnosis not present

## 2017-11-02 DIAGNOSIS — N390 Urinary tract infection, site not specified: Secondary | ICD-10-CM | POA: Diagnosis not present

## 2017-11-03 DIAGNOSIS — Z933 Colostomy status: Secondary | ICD-10-CM | POA: Diagnosis not present

## 2017-11-03 DIAGNOSIS — N390 Urinary tract infection, site not specified: Secondary | ICD-10-CM | POA: Diagnosis not present

## 2017-11-09 DIAGNOSIS — E860 Dehydration: Secondary | ICD-10-CM | POA: Diagnosis not present

## 2017-11-09 DIAGNOSIS — N183 Chronic kidney disease, stage 3 (moderate): Secondary | ICD-10-CM | POA: Diagnosis not present

## 2017-11-09 DIAGNOSIS — K591 Functional diarrhea: Secondary | ICD-10-CM | POA: Diagnosis not present

## 2017-11-10 DIAGNOSIS — R1013 Epigastric pain: Secondary | ICD-10-CM | POA: Diagnosis not present

## 2017-11-10 DIAGNOSIS — E860 Dehydration: Secondary | ICD-10-CM | POA: Diagnosis not present

## 2017-11-10 DIAGNOSIS — K861 Other chronic pancreatitis: Secondary | ICD-10-CM | POA: Diagnosis not present

## 2017-11-11 DIAGNOSIS — E538 Deficiency of other specified B group vitamins: Secondary | ICD-10-CM | POA: Diagnosis not present

## 2017-11-12 DIAGNOSIS — Z933 Colostomy status: Secondary | ICD-10-CM | POA: Diagnosis not present

## 2017-11-18 DIAGNOSIS — Z933 Colostomy status: Secondary | ICD-10-CM | POA: Diagnosis not present

## 2017-11-19 DIAGNOSIS — K219 Gastro-esophageal reflux disease without esophagitis: Secondary | ICD-10-CM | POA: Diagnosis not present

## 2017-11-19 DIAGNOSIS — R1013 Epigastric pain: Secondary | ICD-10-CM | POA: Diagnosis not present

## 2017-11-19 DIAGNOSIS — N189 Chronic kidney disease, unspecified: Secondary | ICD-10-CM | POA: Diagnosis not present

## 2017-11-19 DIAGNOSIS — R69 Illness, unspecified: Secondary | ICD-10-CM | POA: Diagnosis not present

## 2017-11-19 DIAGNOSIS — E860 Dehydration: Secondary | ICD-10-CM | POA: Diagnosis not present

## 2017-11-19 DIAGNOSIS — N183 Chronic kidney disease, stage 3 (moderate): Secondary | ICD-10-CM | POA: Diagnosis not present

## 2017-11-19 DIAGNOSIS — E039 Hypothyroidism, unspecified: Secondary | ICD-10-CM | POA: Diagnosis not present

## 2017-11-19 DIAGNOSIS — Z79899 Other long term (current) drug therapy: Secondary | ICD-10-CM | POA: Diagnosis not present

## 2017-11-22 DIAGNOSIS — H35372 Puckering of macula, left eye: Secondary | ICD-10-CM | POA: Diagnosis not present

## 2017-11-25 DIAGNOSIS — E538 Deficiency of other specified B group vitamins: Secondary | ICD-10-CM | POA: Diagnosis not present

## 2017-12-01 DIAGNOSIS — I999 Unspecified disorder of circulatory system: Secondary | ICD-10-CM | POA: Diagnosis not present

## 2017-12-01 DIAGNOSIS — E860 Dehydration: Secondary | ICD-10-CM | POA: Diagnosis not present

## 2017-12-01 DIAGNOSIS — K912 Postsurgical malabsorption, not elsewhere classified: Secondary | ICD-10-CM | POA: Diagnosis not present

## 2017-12-01 DIAGNOSIS — R197 Diarrhea, unspecified: Secondary | ICD-10-CM | POA: Diagnosis not present

## 2017-12-06 DIAGNOSIS — Z933 Colostomy status: Secondary | ICD-10-CM | POA: Diagnosis not present

## 2017-12-07 DIAGNOSIS — E860 Dehydration: Secondary | ICD-10-CM | POA: Diagnosis not present

## 2017-12-07 DIAGNOSIS — K591 Functional diarrhea: Secondary | ICD-10-CM | POA: Diagnosis not present

## 2017-12-07 DIAGNOSIS — N183 Chronic kidney disease, stage 3 (moderate): Secondary | ICD-10-CM | POA: Diagnosis not present

## 2017-12-08 DIAGNOSIS — N183 Chronic kidney disease, stage 3 (moderate): Secondary | ICD-10-CM | POA: Diagnosis not present

## 2017-12-08 DIAGNOSIS — Z1239 Encounter for other screening for malignant neoplasm of breast: Secondary | ICD-10-CM | POA: Diagnosis not present

## 2017-12-08 DIAGNOSIS — Z1339 Encounter for screening examination for other mental health and behavioral disorders: Secondary | ICD-10-CM | POA: Diagnosis not present

## 2017-12-08 DIAGNOSIS — E039 Hypothyroidism, unspecified: Secondary | ICD-10-CM | POA: Diagnosis not present

## 2017-12-08 DIAGNOSIS — Z6823 Body mass index (BMI) 23.0-23.9, adult: Secondary | ICD-10-CM | POA: Diagnosis not present

## 2017-12-08 DIAGNOSIS — Z939 Artificial opening status, unspecified: Secondary | ICD-10-CM | POA: Diagnosis not present

## 2017-12-08 DIAGNOSIS — R1084 Generalized abdominal pain: Secondary | ICD-10-CM | POA: Diagnosis not present

## 2017-12-08 DIAGNOSIS — Z0001 Encounter for general adult medical examination with abnormal findings: Secondary | ICD-10-CM | POA: Diagnosis not present

## 2017-12-08 DIAGNOSIS — D649 Anemia, unspecified: Secondary | ICD-10-CM | POA: Diagnosis not present

## 2017-12-08 DIAGNOSIS — Z1331 Encounter for screening for depression: Secondary | ICD-10-CM | POA: Diagnosis not present

## 2017-12-09 DIAGNOSIS — E538 Deficiency of other specified B group vitamins: Secondary | ICD-10-CM | POA: Diagnosis not present

## 2017-12-10 DIAGNOSIS — Z933 Colostomy status: Secondary | ICD-10-CM | POA: Diagnosis not present

## 2017-12-10 DIAGNOSIS — R69 Illness, unspecified: Secondary | ICD-10-CM | POA: Diagnosis not present

## 2017-12-21 ENCOUNTER — Telehealth: Payer: Self-pay | Admitting: Neurology

## 2017-12-21 DIAGNOSIS — Z933 Colostomy status: Secondary | ICD-10-CM | POA: Diagnosis not present

## 2017-12-21 DIAGNOSIS — R4189 Other symptoms and signs involving cognitive functions and awareness: Secondary | ICD-10-CM

## 2017-12-21 NOTE — Telephone Encounter (Addendum)
Spoke to Dr. Krista Blue - ok to place new order for neuropsychiatric care.  Returned call to patient's daughter, Rose Phi (on [place]).  She is aware to expect a call for scheduling.

## 2017-12-21 NOTE — Telephone Encounter (Signed)
Pts daughter(Pennie) requesting a call stating she would like to schedule another neuropsychological evaluation but Dr. Valentina Shaggy is no longer with the practice requesting a call to discuss where else the pt can go

## 2017-12-23 DIAGNOSIS — E538 Deficiency of other specified B group vitamins: Secondary | ICD-10-CM | POA: Diagnosis not present

## 2018-01-04 DIAGNOSIS — K591 Functional diarrhea: Secondary | ICD-10-CM | POA: Diagnosis not present

## 2018-01-04 DIAGNOSIS — E860 Dehydration: Secondary | ICD-10-CM | POA: Diagnosis not present

## 2018-01-04 DIAGNOSIS — N183 Chronic kidney disease, stage 3 (moderate): Secondary | ICD-10-CM | POA: Diagnosis not present

## 2018-01-06 DIAGNOSIS — Z933 Colostomy status: Secondary | ICD-10-CM | POA: Diagnosis not present

## 2018-01-06 DIAGNOSIS — E538 Deficiency of other specified B group vitamins: Secondary | ICD-10-CM | POA: Diagnosis not present

## 2018-01-12 DIAGNOSIS — D649 Anemia, unspecified: Secondary | ICD-10-CM | POA: Diagnosis not present

## 2018-01-14 DIAGNOSIS — Z1231 Encounter for screening mammogram for malignant neoplasm of breast: Secondary | ICD-10-CM | POA: Diagnosis not present

## 2018-01-20 DIAGNOSIS — E538 Deficiency of other specified B group vitamins: Secondary | ICD-10-CM | POA: Diagnosis not present

## 2018-01-21 DIAGNOSIS — R413 Other amnesia: Secondary | ICD-10-CM | POA: Diagnosis not present

## 2018-01-21 DIAGNOSIS — K219 Gastro-esophageal reflux disease without esophagitis: Secondary | ICD-10-CM | POA: Diagnosis not present

## 2018-01-21 DIAGNOSIS — Z933 Colostomy status: Secondary | ICD-10-CM | POA: Diagnosis not present

## 2018-01-21 DIAGNOSIS — N184 Chronic kidney disease, stage 4 (severe): Secondary | ICD-10-CM | POA: Diagnosis not present

## 2018-01-21 DIAGNOSIS — E039 Hypothyroidism, unspecified: Secondary | ICD-10-CM | POA: Diagnosis not present

## 2018-01-21 DIAGNOSIS — K5190 Ulcerative colitis, unspecified, without complications: Secondary | ICD-10-CM | POA: Diagnosis not present

## 2018-01-21 DIAGNOSIS — I129 Hypertensive chronic kidney disease with stage 1 through stage 4 chronic kidney disease, or unspecified chronic kidney disease: Secondary | ICD-10-CM | POA: Diagnosis not present

## 2018-01-21 DIAGNOSIS — Z9621 Cochlear implant status: Secondary | ICD-10-CM | POA: Diagnosis not present

## 2018-01-21 DIAGNOSIS — N2581 Secondary hyperparathyroidism of renal origin: Secondary | ICD-10-CM | POA: Diagnosis not present

## 2018-01-21 DIAGNOSIS — R760 Raised antibody titer: Secondary | ICD-10-CM | POA: Diagnosis not present

## 2018-01-21 DIAGNOSIS — D631 Anemia in chronic kidney disease: Secondary | ICD-10-CM | POA: Diagnosis not present

## 2018-01-21 DIAGNOSIS — N189 Chronic kidney disease, unspecified: Secondary | ICD-10-CM | POA: Diagnosis not present

## 2018-02-01 DIAGNOSIS — E860 Dehydration: Secondary | ICD-10-CM | POA: Diagnosis not present

## 2018-02-01 DIAGNOSIS — K591 Functional diarrhea: Secondary | ICD-10-CM | POA: Diagnosis not present

## 2018-02-01 DIAGNOSIS — N183 Chronic kidney disease, stage 3 (moderate): Secondary | ICD-10-CM | POA: Diagnosis not present

## 2018-02-03 DIAGNOSIS — E538 Deficiency of other specified B group vitamins: Secondary | ICD-10-CM | POA: Diagnosis not present

## 2018-02-04 DIAGNOSIS — Z933 Colostomy status: Secondary | ICD-10-CM | POA: Diagnosis not present

## 2018-02-09 DIAGNOSIS — N184 Chronic kidney disease, stage 4 (severe): Secondary | ICD-10-CM | POA: Diagnosis not present

## 2018-02-09 DIAGNOSIS — K219 Gastro-esophageal reflux disease without esophagitis: Secondary | ICD-10-CM | POA: Diagnosis not present

## 2018-02-09 DIAGNOSIS — K861 Other chronic pancreatitis: Secondary | ICD-10-CM | POA: Diagnosis not present

## 2018-02-09 DIAGNOSIS — K5190 Ulcerative colitis, unspecified, without complications: Secondary | ICD-10-CM | POA: Diagnosis not present

## 2018-02-09 DIAGNOSIS — E559 Vitamin D deficiency, unspecified: Secondary | ICD-10-CM | POA: Diagnosis not present

## 2018-02-09 DIAGNOSIS — Z932 Ileostomy status: Secondary | ICD-10-CM | POA: Diagnosis not present

## 2018-02-09 DIAGNOSIS — R69 Illness, unspecified: Secondary | ICD-10-CM | POA: Diagnosis not present

## 2018-02-09 DIAGNOSIS — G8929 Other chronic pain: Secondary | ICD-10-CM | POA: Diagnosis not present

## 2018-02-09 DIAGNOSIS — E039 Hypothyroidism, unspecified: Secondary | ICD-10-CM | POA: Diagnosis not present

## 2018-02-10 DIAGNOSIS — K225 Diverticulum of esophagus, acquired: Secondary | ICD-10-CM | POA: Diagnosis not present

## 2018-02-10 DIAGNOSIS — E039 Hypothyroidism, unspecified: Secondary | ICD-10-CM | POA: Diagnosis not present

## 2018-02-10 DIAGNOSIS — K2950 Unspecified chronic gastritis without bleeding: Secondary | ICD-10-CM | POA: Diagnosis not present

## 2018-02-10 DIAGNOSIS — K3189 Other diseases of stomach and duodenum: Secondary | ICD-10-CM | POA: Diagnosis not present

## 2018-02-10 DIAGNOSIS — K861 Other chronic pancreatitis: Secondary | ICD-10-CM | POA: Diagnosis not present

## 2018-02-10 DIAGNOSIS — Z9181 History of falling: Secondary | ICD-10-CM | POA: Diagnosis not present

## 2018-02-10 DIAGNOSIS — R69 Illness, unspecified: Secondary | ICD-10-CM | POA: Diagnosis not present

## 2018-02-10 DIAGNOSIS — K838 Other specified diseases of biliary tract: Secondary | ICD-10-CM | POA: Diagnosis not present

## 2018-02-10 DIAGNOSIS — Z880 Allergy status to penicillin: Secondary | ICD-10-CM | POA: Diagnosis not present

## 2018-02-10 DIAGNOSIS — Z79899 Other long term (current) drug therapy: Secondary | ICD-10-CM | POA: Diagnosis not present

## 2018-02-10 DIAGNOSIS — N184 Chronic kidney disease, stage 4 (severe): Secondary | ICD-10-CM | POA: Diagnosis not present

## 2018-02-10 DIAGNOSIS — R933 Abnormal findings on diagnostic imaging of other parts of digestive tract: Secondary | ICD-10-CM | POA: Diagnosis not present

## 2018-02-10 DIAGNOSIS — K259 Gastric ulcer, unspecified as acute or chronic, without hemorrhage or perforation: Secondary | ICD-10-CM | POA: Diagnosis not present

## 2018-02-10 DIAGNOSIS — Q396 Congenital diverticulum of esophagus: Secondary | ICD-10-CM | POA: Diagnosis not present

## 2018-02-10 DIAGNOSIS — Z933 Colostomy status: Secondary | ICD-10-CM | POA: Diagnosis not present

## 2018-02-10 DIAGNOSIS — K869 Disease of pancreas, unspecified: Secondary | ICD-10-CM | POA: Diagnosis not present

## 2018-02-10 DIAGNOSIS — R1013 Epigastric pain: Secondary | ICD-10-CM | POA: Diagnosis not present

## 2018-02-17 DIAGNOSIS — Z933 Colostomy status: Secondary | ICD-10-CM | POA: Diagnosis not present

## 2018-03-01 DIAGNOSIS — N183 Chronic kidney disease, stage 3 (moderate): Secondary | ICD-10-CM | POA: Diagnosis not present

## 2018-03-01 DIAGNOSIS — E860 Dehydration: Secondary | ICD-10-CM | POA: Diagnosis not present

## 2018-03-01 DIAGNOSIS — K591 Functional diarrhea: Secondary | ICD-10-CM | POA: Diagnosis not present

## 2018-03-03 ENCOUNTER — Telehealth: Payer: Self-pay | Admitting: Neurology

## 2018-03-03 NOTE — Telephone Encounter (Signed)
Returned call to her daughter, Rose Phi (on [place]).  Dr. Krista Blue last placed a psychiatric referral in 2017.  Another physician placed an order in [place] in 11/2017.  She was last seen here in 07/2017 and has a pending appt on 03/10/2018.   I left a detailed message for patient's daughter for the patient to please keep her pending appt on 03/10/2018 and any appropriated referrals will be made at that time.   Provided our number to call back with any questions.

## 2018-03-03 NOTE — Telephone Encounter (Signed)
Pt called stating referral timed out needing a new one sent over for neuropsychological evaluation . Please advise.

## 2018-03-04 DIAGNOSIS — N184 Chronic kidney disease, stage 4 (severe): Secondary | ICD-10-CM | POA: Diagnosis not present

## 2018-03-04 DIAGNOSIS — Z933 Colostomy status: Secondary | ICD-10-CM | POA: Diagnosis not present

## 2018-03-07 DIAGNOSIS — E538 Deficiency of other specified B group vitamins: Secondary | ICD-10-CM | POA: Diagnosis not present

## 2018-03-08 NOTE — Progress Notes (Signed)
[facility]  PATIENT: Bonnie Butler DOB: [birth year]   REASON FOR VISIT: Follow-up for mild cognitive impairment,  abnormality of gait HISTORY FROM: Patient and daughter Bonnie Butler and DIL Bonnie Butler    HISTORY OF PRESENT ILLNESS: Bonnie Butler is a 84 years old right-handed female, accompanied by her daughter Kieth Butler, seen in refer by her primary care physician Dr. [name] in March 26 2015 for evaluation of memory loss, unsteady gait tendency to lean backwards.  She had a history of ulcerative colitis, had a history of ileostomy since 1978,  history of progressive hearing loss had right cochlear implantation, still has poor hearing, wearing left hearing aid, long-term Coumadin for positive lupus anticoagulants, vitamin B12 deficiency, hypothyroidism, on supplement,  I have reviewed and summarized her previous neurological record from Dr. [name] in 2006, she was evaluated for similar complaints of progressive memory loss, per record, MRI of the brain in 2006 showed nonspecific hemisphere white matter changes  She graduates from high school, used to work as a International aid/development worker job, she was disabled since 1980s, she lives at her house of 50 acres, lives alone, manage her own household, still driving short distances, has no significant difficulty managing her bills, her sister at age 32, has mild memory trouble, she also has significant depression anxiety symptoms. She has been treated with Exelon patch 9.6 milligrams daily  She was noted to have slow worsening memory trouble tends to repeat herself, no longer cooking regularly, difficulty to keep up with her grandchildren's birthday, she also has gradual onset gait difficulty over the past few years, hold on furniture to walk, tendency to fell backwards, dizziness spinning sensation sometimes  We have reviewed her CAT scan in 2017, there was right cochlear implant, generalized atrophy, especially  superior cerebellum atrophy, no acute lesions.  Update April 10 2015:Overall, her gait has much improved, she only take meclizine as needed for dizziness, complains of drowsiness with medications, We have reviewed the film of CAT scan of cervical spine in April 01 2015 from [facility] radiology at [facility], there is broad-based disc osteophyte complex and uncovertebral spurring at C6-7 with moderate foraminal stenosis, left greater than right, moderate foraminal stenosis is also present at T1-2, worse on the left, shallow disc protrusion at C2-3, C3-4 without significant stenosis, mild left foraminal narrowing at C4-5, asymmetric left-sided facet hypertrophy at C4-5, C5-6, disease progressed since 2011 including C6 and 7  She was taking Namenda 10 mg daily instead of twice a day, tolerating medication well, daughter reported slow worsening memory trouble, making mistakes on her bill, more confused about her medications  UPDATE August 16th 2017:YY She is with her daughter at today's clinical visit, continue lives alone, she still drives, she drive 30 miles to visit her sister who suffered dementia, stated nursing home, her gait has improved with physical therapy she denies significant pain,  She had neuropsychiatric evaluation by Dr. Valentina Shaggy in July 2017, went over findings with patient and her daughter in detail, 7 normal range, she demonstrate impairments at visual memory, visual constructive scale, executive ability, abstract reasoning/problem solving, testing represent a significant decline in her cognitive function, compared to previous baseline in 2011, suggested diagnosis of mild cognitive impairment versus mild dementia,  UPDATE 03/13/2018CM [name], 84 year old female returns for follow-up with her daughter who is her power of attorney. She has a history of mild cognitive impairment and is currently on Exelon and Namenda without side effects. MMSE score today is stable at  29  out of 30. She had evaluation by Dr. Valentina Shaggy July of last year which suggested diagnosis of mild cognitive impairment versus mild dementia. She continues to live alone, there have been no safety issues identified she was hospitalized in December for acute pancreatitis. Since that time there has been more family involvement to check on her frequently. She continues to cook. She remains fairly independent in activities of daily living. Daughter does her finances. Patient just had her driver's license renewed and drives short distances in her community. She returns for reevaluation. She also has a history of depression. She has not had any recent falls UPDATE August 10 2017:YY She is accompanied by her daughter at today's clinical visit, who is visiting her from [place] she has worsening memory loss, tends to repeat qustions, she is reluctant to reach out for help, per daughter, she has missed some of her medications, she still drives short distance, her son lives just 10 minutes away from her Mini-Mental Status Examination is 27/30 today, with 10 animal naming. UPDATE 1/9/2020CM [name], 84 year old female returns for follow-up with history of progressive memory loss.  She is with her daughter and daughter-in-law today.  Daughter reports that she has been staying with her for the last month or so.  She lives in [place].  She has noticed a sharp decline since her appointment 6 months ago.  She is having problems administering her medications even though they are in a pillbox.  Sometimes she admits the medications and sometimes she takes a double dose.  Patient is resistant to having any in-home health.  She has increased confusion in the evenings.  She has problems maintaining and keeping her house clean.  She has problems with cooking and shopping.  Daughter is concerned that she continues to drive and she probably should not be driving.  Patient also has stage IV kidney disease.  Her daughter-in-law lives close  by but has not checked on her recently because she had surgery.  Her son who lives nearby is a Programmer, systems.  Patient is extremely hard of hearing and only has 15% hearing in the left ear none in the right.  She reads lips.  She returns for reevaluation REVIEW OF SYSTEMS: Full 14 system review of systems performed and notable only for those listed, all others are neg:  Constitutional: Fatigue  Cardiovascular: neg Ear/Nose/Throat: Hearing loss Skin: neg Eyes: neg Respiratory: neg Gastroitestinal: Diarrhea Hematology/Lymphatic: neg  Endocrine: Intolerance to heat and cold Musculoskeletal: Joint pain Allergy/Immunology: neg Neurological: Memory loss, weakness Psychiatric: Depression and anxiety, decreased concentration Sleep : neg   ALLERGIES: Allergies  Allergen Reactions  . Penicillins Rash  . Sulfa Antibiotics Rash    Other Reaction: Other reaction  . Macrobid [Nitrofurantoin] Rash  . Rocephin [Ceftriaxone Sodium In Dextrose] Rash  . Tape Rash    HOME MEDICATIONS: Outpatient Medications Prior to Visit  Medication Sig Dispense Refill  . ALPRAZolam (XANAX) 0.5 MG tablet Take 0.5 mg by mouth 2 (two) times daily as needed.    . cyanocobalamin (,VITAMIN B-12,) 1000 MCG/ML injection Inject 1,000 mcg into the muscle. Every 15 days    . diphenoxylate-atropine (LOMOTIL) 2.5-0.025 MG tablet Take by mouth as needed.    Marland Kitchen escitalopram (LEXAPRO) 20 MG tablet Take 20 mg by mouth daily.  5  . hypromellose (GENTEAL SEVERE) 0.3 % GEL ophthalmic ointment Place into both eyes as needed.    Marland Kitchen levothyroxine (SYNTHROID, LEVOTHROID) 50 MCG tablet Take 50 mcg by mouth daily.  11  . lipase/protease/amylase (CREON) 12000 units CPEP capsule Take 3,600 Units by mouth.    . loperamide (IMODIUM) 2 MG capsule Take 4 mg by mouth every 6 (six) hours.     . memantine (NAMENDA) 10 MG tablet TAKE 1 TABLET BY MOUTH TWICE A DAY 180 tablet 1  . mirtazapine (REMERON) 7.5 MG tablet Take 7.5 mg by mouth  as needed.    . ondansetron (ZOFRAN) 4 MG tablet Take 4 mg by mouth as needed.    . RABEprazole (ACIPHEX) 20 MG tablet Take 20 mg by mouth 2 (two) times daily.    . rivastigmine (EXELON) 9.5 mg/24hr daily.    . traMADol (ULTRAM) 50 MG tablet Take 50 mg by mouth every 6 (six) hours as needed. for pain  5  . lansoprazole (PREVACID) 30 MG capsule Take 30 mg by mouth daily at 12 noon.     No facility-administered medications prior to visit.     PAST MEDICAL HISTORY: Past Medical History:  Diagnosis Date  . Anxiety   . Broken upper arm    right  . Chronic renal disease, stage 3, moderately decreased glomerular filtration rate between 30-59 mL/min/1.73 square meter (HCC)   . DDD (degenerative disc disease), cervical   . Dementia ([place])   . Depression   . Dry eyes   . GERD (gastroesophageal reflux disease)   . H/O ileostomy   . Hearing loss   . Hypothyroidism   . Low protein C activity level (HCC)   . Memory loss   . Osteoarthritis   . Renal cyst   . Short gut syndrome   . Ulcerative colitis ([place])   . Vertigo     PAST SURGICAL HISTORY: Past Surgical History:  Procedure Laterality Date  . APPENDECTOMY    . CATARACT EXTRACTION     bilateral  . COCHLEAR IMPLANT     right  . colectomy    . ILEOSTOMY    . VAGINAL HYSTERECTOMY      FAMILY HISTORY: Family History  Problem Relation Age of Onset  . Congestive Heart Failure Mother   . Kidney disease Father   . Lung cancer Brother   . Stroke Sister   . COPD Sister     SOCIAL HISTORY: Social History   Socioeconomic History  . Marital status: Widowed    Spouse name: Not on file  . Number of children: 3  . Years of education: 69  . Highest education level: Not on file  Occupational History  . Occupation: Retired  Scientific laboratory technician  . Financial resource strain: Not on file  . Food insecurity:    Worry: Not on file    Inability: Not on file  . Transportation needs:    Medical: Not on file    Non-medical: Not on file    Tobacco Use  . Smoking status: Never Smoker  . Smokeless tobacco: Never Used  Substance and Sexual Activity  . Alcohol use: No    Alcohol/week: 0.0 standard drinks  . Drug use: No  . Sexual activity: Not on file  Lifestyle  . Physical activity:    Days per week: Not on file    Minutes per session: Not on file  . Stress: Not on file  Relationships  . Social connections:    Talks on phone: Not on file    Gets together: Not on file    Attends religious service: Not on file    Active member of club or organization: Not on file  Attends meetings of clubs or organizations: Not on file    Relationship status: Not on file  . Intimate partner violence:    Fear of current or ex partner: Not on file    Emotionally abused: Not on file    Physically abused: Not on file    Forced sexual activity: Not on file  Other Topics Concern  . Not on file  Social History Narrative   Lives at home alone.   Right-handed.   Occasional caffeine use.     PHYSICAL EXAM  Vitals:   03/10/18 0931  BP: (!) 133/58  Pulse: 86  Weight: 134 lb (60.8 kg)  Height: 5\' 4"  (1.626 m)   Body mass index is 23 kg/m.  Generalized: Well developed, in no acute distress  Head: normocephalic and atraumatic,. Oropharynx benign  Neck: Supple,  Musculoskeletal: No deformity   Neurological examination   Mentation: Alert .  Follows all commands speech and language fluent.  MMSE - Mini Mental State Exam 03/10/2018 05/12/2016 10/16/2015  Not completed: (No Data) - -  Orientation to time 3 5 3   Orientation to Place 5 4 5   Registration 3 3 3   Attention/ Calculation 3 5 5   Recall 1 3 1   Language- name 2 objects 2 2 2   Language- repeat 1 1 1   Language- follow 3 step command 3 3 3   Language- read & follow direction 1 1 1   Write a sentence 1 1 1   Copy design 1 1 1   Total score 24 29 26   Clock drawing 4/4.   Cranial nerve II-XII: Pupils were equal round reactive to light extraocular movements were full, visual  field were full on confrontational test. Facial sensation and strength were normal. Very hard of hearing. Reads lips. Uvula tongue midline. head turning and shoulder shrug were normal and symmetric.Tongue protrusion into cheek strength was normal. Motor: normal bulk and tone, full strength in the BUE, BLE,  Sensory: normal and symmetric to light touch,  Coordination: finger-nose-finger, heel-to-shin bilaterally, no dysmetria, no tremor Reflexes: Symmetric upper and lower plantar responses were flexor bilaterally. Gait and Station: Rising up from seated position without assistance, normal stance,  moderate stride, good arm swing, smooth turning, able to perform tiptoe, and heel walking without difficulty. Tandem gait is unsteady, no assistive device  DIAGNOSTIC DATA (LABS, IMAGING, TESTING) -  ASSESSMENT AND PLAN  84 y.o. year old female  has a past medical history of unsteady gait which is multifactorial consistent with her sensory neuro hearing loss/vestibular system malfunction. Cervical spine film shows multilevel degenerative changes no evidence of cervical myelopathy. Mild cognitive impairment without behavior issues complicated by depression. There is a family history of dementia.  Memory loss is progressive.  Last neuro psych exam in 2017   PLAN: Continue Exelon patch at current dose Continue Namenda at current dose Neuropsych evaluation requested by daughter( last with Dr. Valentina Shaggy 2017) Pt requires more in home assistance, contact agencies in your area [facility]) Recommend patient should not be driving due to memory issues and she only has 15% hearing in left ear, none in right Create a safe environment, Reduce confusion, keep familiar objects and people around, stick to a routine Use effective communication such as simple words and short sentences Reduce nighttime restlessness, a consistent nighttime routine,  avoid napping during the day Encourage good nutrition and hydration F/U 6  months I spent 25 min  in total face to face time with the patient and daughter more than 50% of which was  spent counseling and coordination of care, reviewing test results reviewing medications and discussing and reviewing the diagnosis of mild cognitive impairment/progressive memory loss, importance of safety in the home.  Patient should not be driving, and further treatment options. , Rayburn Ma, [facility], APRN  [facility] [address] [place], [place] [address] [phone]

## 2018-03-10 ENCOUNTER — Encounter: Payer: Self-pay | Admitting: Nurse Practitioner

## 2018-03-10 ENCOUNTER — Ambulatory Visit: Payer: Medicare HMO | Admitting: Nurse Practitioner

## 2018-03-10 VITALS — BP 133/58 | HR 86 | Ht 64.0 in | Wt 134.0 lb

## 2018-03-10 DIAGNOSIS — G3184 Mild cognitive impairment, so stated: Secondary | ICD-10-CM | POA: Diagnosis not present

## 2018-03-10 DIAGNOSIS — R269 Unspecified abnormalities of gait and mobility: Secondary | ICD-10-CM | POA: Diagnosis not present

## 2018-03-10 DIAGNOSIS — R69 Illness, unspecified: Secondary | ICD-10-CM | POA: Diagnosis not present

## 2018-03-10 DIAGNOSIS — F329 Major depressive disorder, single episode, unspecified: Secondary | ICD-10-CM | POA: Diagnosis not present

## 2018-03-10 DIAGNOSIS — F32A Depression, unspecified: Secondary | ICD-10-CM

## 2018-03-10 DIAGNOSIS — R413 Other amnesia: Secondary | ICD-10-CM | POA: Diagnosis not present

## 2018-03-10 MED ORDER — MEMANTINE HCL 10 MG PO TABS: 10.0000 mg | ORAL_TABLET | Freq: Two times a day (BID) | ORAL | 1 refills | Status: DC

## 2018-03-10 NOTE — Progress Notes (Signed)
I have reviewed and agreed above plan. 

## 2018-03-10 NOTE — Patient Instructions (Signed)
Continue Exelon patch at current dose Continue Namenda at current dose Neuropsych eval last with Dr. Valentina Shaggy 2017 Pt requires more in home assistance, contact agencies in your area [facility]) Recommend patient should not be driving due to memory issues and she only has 15% hearing in left ear, none in right F/U in 6 months

## 2018-03-19 DIAGNOSIS — Z66 Do not resuscitate: Secondary | ICD-10-CM | POA: Diagnosis not present

## 2018-03-19 DIAGNOSIS — R69 Illness, unspecified: Secondary | ICD-10-CM | POA: Diagnosis not present

## 2018-03-19 DIAGNOSIS — N184 Chronic kidney disease, stage 4 (severe): Secondary | ICD-10-CM | POA: Diagnosis not present

## 2018-03-19 DIAGNOSIS — E039 Hypothyroidism, unspecified: Secondary | ICD-10-CM | POA: Diagnosis not present

## 2018-03-19 DIAGNOSIS — I160 Hypertensive urgency: Secondary | ICD-10-CM | POA: Diagnosis not present

## 2018-03-19 DIAGNOSIS — E871 Hypo-osmolality and hyponatremia: Secondary | ICD-10-CM | POA: Diagnosis not present

## 2018-03-19 DIAGNOSIS — E860 Dehydration: Secondary | ICD-10-CM | POA: Diagnosis not present

## 2018-03-19 DIAGNOSIS — R55 Syncope and collapse: Secondary | ICD-10-CM | POA: Diagnosis not present

## 2018-03-19 DIAGNOSIS — K219 Gastro-esophageal reflux disease without esophagitis: Secondary | ICD-10-CM | POA: Diagnosis not present

## 2018-03-19 DIAGNOSIS — N189 Chronic kidney disease, unspecified: Secondary | ICD-10-CM | POA: Diagnosis not present

## 2018-03-19 DIAGNOSIS — F0390 Unspecified dementia without behavioral disturbance: Secondary | ICD-10-CM

## 2018-03-19 DIAGNOSIS — R110 Nausea: Secondary | ICD-10-CM | POA: Diagnosis not present

## 2018-03-19 DIAGNOSIS — R531 Weakness: Secondary | ICD-10-CM | POA: Diagnosis not present

## 2018-03-23 DIAGNOSIS — E860 Dehydration: Secondary | ICD-10-CM | POA: Diagnosis not present

## 2018-03-23 DIAGNOSIS — N184 Chronic kidney disease, stage 4 (severe): Secondary | ICD-10-CM | POA: Diagnosis not present

## 2018-03-23 DIAGNOSIS — K8681 Exocrine pancreatic insufficiency: Secondary | ICD-10-CM | POA: Diagnosis not present

## 2018-03-23 DIAGNOSIS — I129 Hypertensive chronic kidney disease with stage 1 through stage 4 chronic kidney disease, or unspecified chronic kidney disease: Secondary | ICD-10-CM | POA: Diagnosis not present

## 2018-03-23 DIAGNOSIS — E871 Hypo-osmolality and hyponatremia: Secondary | ICD-10-CM | POA: Diagnosis not present

## 2018-03-23 DIAGNOSIS — H547 Unspecified visual loss: Secondary | ICD-10-CM | POA: Diagnosis not present

## 2018-03-23 DIAGNOSIS — E039 Hypothyroidism, unspecified: Secondary | ICD-10-CM | POA: Diagnosis not present

## 2018-03-23 DIAGNOSIS — D649 Anemia, unspecified: Secondary | ICD-10-CM | POA: Diagnosis not present

## 2018-03-23 DIAGNOSIS — R69 Illness, unspecified: Secondary | ICD-10-CM | POA: Diagnosis not present

## 2018-03-23 DIAGNOSIS — E538 Deficiency of other specified B group vitamins: Secondary | ICD-10-CM | POA: Diagnosis not present

## 2018-03-23 DIAGNOSIS — E876 Hypokalemia: Secondary | ICD-10-CM | POA: Diagnosis not present

## 2018-03-23 DIAGNOSIS — M1991 Primary osteoarthritis, unspecified site: Secondary | ICD-10-CM | POA: Diagnosis not present

## 2018-03-24 DIAGNOSIS — Z6822 Body mass index (BMI) 22.0-22.9, adult: Secondary | ICD-10-CM | POA: Diagnosis not present

## 2018-03-24 DIAGNOSIS — Z09 Encounter for follow-up examination after completed treatment for conditions other than malignant neoplasm: Secondary | ICD-10-CM | POA: Diagnosis not present

## 2018-03-24 DIAGNOSIS — E871 Hypo-osmolality and hyponatremia: Secondary | ICD-10-CM | POA: Diagnosis not present

## 2018-03-25 DIAGNOSIS — R69 Illness, unspecified: Secondary | ICD-10-CM | POA: Diagnosis not present

## 2018-03-28 DIAGNOSIS — E039 Hypothyroidism, unspecified: Secondary | ICD-10-CM | POA: Diagnosis not present

## 2018-03-28 DIAGNOSIS — H547 Unspecified visual loss: Secondary | ICD-10-CM | POA: Diagnosis not present

## 2018-03-28 DIAGNOSIS — K8681 Exocrine pancreatic insufficiency: Secondary | ICD-10-CM | POA: Diagnosis not present

## 2018-03-28 DIAGNOSIS — I129 Hypertensive chronic kidney disease with stage 1 through stage 4 chronic kidney disease, or unspecified chronic kidney disease: Secondary | ICD-10-CM | POA: Diagnosis not present

## 2018-03-28 DIAGNOSIS — N184 Chronic kidney disease, stage 4 (severe): Secondary | ICD-10-CM | POA: Diagnosis not present

## 2018-03-28 DIAGNOSIS — R69 Illness, unspecified: Secondary | ICD-10-CM | POA: Diagnosis not present

## 2018-03-28 DIAGNOSIS — M1991 Primary osteoarthritis, unspecified site: Secondary | ICD-10-CM | POA: Diagnosis not present

## 2018-03-28 DIAGNOSIS — E876 Hypokalemia: Secondary | ICD-10-CM | POA: Diagnosis not present

## 2018-03-28 DIAGNOSIS — E871 Hypo-osmolality and hyponatremia: Secondary | ICD-10-CM | POA: Diagnosis not present

## 2018-03-29 DIAGNOSIS — N184 Chronic kidney disease, stage 4 (severe): Secondary | ICD-10-CM | POA: Diagnosis not present

## 2018-03-29 DIAGNOSIS — H547 Unspecified visual loss: Secondary | ICD-10-CM | POA: Diagnosis not present

## 2018-03-29 DIAGNOSIS — E039 Hypothyroidism, unspecified: Secondary | ICD-10-CM | POA: Diagnosis not present

## 2018-03-29 DIAGNOSIS — R69 Illness, unspecified: Secondary | ICD-10-CM | POA: Diagnosis not present

## 2018-03-29 DIAGNOSIS — M1991 Primary osteoarthritis, unspecified site: Secondary | ICD-10-CM | POA: Diagnosis not present

## 2018-03-29 DIAGNOSIS — K8681 Exocrine pancreatic insufficiency: Secondary | ICD-10-CM | POA: Diagnosis not present

## 2018-03-29 DIAGNOSIS — I129 Hypertensive chronic kidney disease with stage 1 through stage 4 chronic kidney disease, or unspecified chronic kidney disease: Secondary | ICD-10-CM | POA: Diagnosis not present

## 2018-03-29 DIAGNOSIS — E876 Hypokalemia: Secondary | ICD-10-CM | POA: Diagnosis not present

## 2018-03-29 DIAGNOSIS — E871 Hypo-osmolality and hyponatremia: Secondary | ICD-10-CM | POA: Diagnosis not present

## 2018-03-30 DIAGNOSIS — N183 Chronic kidney disease, stage 3 (moderate): Secondary | ICD-10-CM | POA: Diagnosis not present

## 2018-03-30 DIAGNOSIS — E860 Dehydration: Secondary | ICD-10-CM | POA: Diagnosis not present

## 2018-03-30 DIAGNOSIS — Z933 Colostomy status: Secondary | ICD-10-CM | POA: Diagnosis not present

## 2018-03-30 DIAGNOSIS — K591 Functional diarrhea: Secondary | ICD-10-CM | POA: Diagnosis not present

## 2018-04-01 DIAGNOSIS — M1991 Primary osteoarthritis, unspecified site: Secondary | ICD-10-CM | POA: Diagnosis not present

## 2018-04-01 DIAGNOSIS — K8681 Exocrine pancreatic insufficiency: Secondary | ICD-10-CM | POA: Diagnosis not present

## 2018-04-01 DIAGNOSIS — E871 Hypo-osmolality and hyponatremia: Secondary | ICD-10-CM | POA: Diagnosis not present

## 2018-04-01 DIAGNOSIS — H547 Unspecified visual loss: Secondary | ICD-10-CM | POA: Diagnosis not present

## 2018-04-01 DIAGNOSIS — R69 Illness, unspecified: Secondary | ICD-10-CM | POA: Diagnosis not present

## 2018-04-01 DIAGNOSIS — E876 Hypokalemia: Secondary | ICD-10-CM | POA: Diagnosis not present

## 2018-04-01 DIAGNOSIS — I129 Hypertensive chronic kidney disease with stage 1 through stage 4 chronic kidney disease, or unspecified chronic kidney disease: Secondary | ICD-10-CM | POA: Diagnosis not present

## 2018-04-01 DIAGNOSIS — E039 Hypothyroidism, unspecified: Secondary | ICD-10-CM | POA: Diagnosis not present

## 2018-04-01 DIAGNOSIS — N184 Chronic kidney disease, stage 4 (severe): Secondary | ICD-10-CM | POA: Diagnosis not present

## 2018-04-04 DIAGNOSIS — E039 Hypothyroidism, unspecified: Secondary | ICD-10-CM | POA: Diagnosis not present

## 2018-04-04 DIAGNOSIS — E871 Hypo-osmolality and hyponatremia: Secondary | ICD-10-CM | POA: Diagnosis not present

## 2018-04-04 DIAGNOSIS — I129 Hypertensive chronic kidney disease with stage 1 through stage 4 chronic kidney disease, or unspecified chronic kidney disease: Secondary | ICD-10-CM | POA: Diagnosis not present

## 2018-04-04 DIAGNOSIS — M1991 Primary osteoarthritis, unspecified site: Secondary | ICD-10-CM | POA: Diagnosis not present

## 2018-04-04 DIAGNOSIS — D649 Anemia, unspecified: Secondary | ICD-10-CM | POA: Diagnosis not present

## 2018-04-04 DIAGNOSIS — N184 Chronic kidney disease, stage 4 (severe): Secondary | ICD-10-CM | POA: Diagnosis not present

## 2018-04-04 DIAGNOSIS — E860 Dehydration: Secondary | ICD-10-CM | POA: Diagnosis not present

## 2018-04-04 DIAGNOSIS — R69 Illness, unspecified: Secondary | ICD-10-CM | POA: Diagnosis not present

## 2018-04-04 DIAGNOSIS — E876 Hypokalemia: Secondary | ICD-10-CM | POA: Diagnosis not present

## 2018-04-06 DIAGNOSIS — E871 Hypo-osmolality and hyponatremia: Secondary | ICD-10-CM | POA: Diagnosis not present

## 2018-04-06 DIAGNOSIS — M1991 Primary osteoarthritis, unspecified site: Secondary | ICD-10-CM | POA: Diagnosis not present

## 2018-04-06 DIAGNOSIS — R69 Illness, unspecified: Secondary | ICD-10-CM | POA: Diagnosis not present

## 2018-04-06 DIAGNOSIS — E860 Dehydration: Secondary | ICD-10-CM | POA: Diagnosis not present

## 2018-04-06 DIAGNOSIS — E876 Hypokalemia: Secondary | ICD-10-CM | POA: Diagnosis not present

## 2018-04-06 DIAGNOSIS — E039 Hypothyroidism, unspecified: Secondary | ICD-10-CM | POA: Diagnosis not present

## 2018-04-06 DIAGNOSIS — D649 Anemia, unspecified: Secondary | ICD-10-CM | POA: Diagnosis not present

## 2018-04-06 DIAGNOSIS — N184 Chronic kidney disease, stage 4 (severe): Secondary | ICD-10-CM | POA: Diagnosis not present

## 2018-04-06 DIAGNOSIS — I129 Hypertensive chronic kidney disease with stage 1 through stage 4 chronic kidney disease, or unspecified chronic kidney disease: Secondary | ICD-10-CM | POA: Diagnosis not present

## 2018-04-12 DIAGNOSIS — E860 Dehydration: Secondary | ICD-10-CM | POA: Diagnosis not present

## 2018-04-12 DIAGNOSIS — N184 Chronic kidney disease, stage 4 (severe): Secondary | ICD-10-CM | POA: Diagnosis not present

## 2018-04-12 DIAGNOSIS — D649 Anemia, unspecified: Secondary | ICD-10-CM | POA: Diagnosis not present

## 2018-04-12 DIAGNOSIS — N189 Chronic kidney disease, unspecified: Secondary | ICD-10-CM | POA: Diagnosis not present

## 2018-04-12 DIAGNOSIS — R110 Nausea: Secondary | ICD-10-CM | POA: Diagnosis not present

## 2018-04-12 DIAGNOSIS — M1991 Primary osteoarthritis, unspecified site: Secondary | ICD-10-CM | POA: Diagnosis not present

## 2018-04-12 DIAGNOSIS — E876 Hypokalemia: Secondary | ICD-10-CM | POA: Diagnosis not present

## 2018-04-12 DIAGNOSIS — R531 Weakness: Secondary | ICD-10-CM | POA: Diagnosis not present

## 2018-04-12 DIAGNOSIS — I129 Hypertensive chronic kidney disease with stage 1 through stage 4 chronic kidney disease, or unspecified chronic kidney disease: Secondary | ICD-10-CM | POA: Diagnosis not present

## 2018-04-12 DIAGNOSIS — E039 Hypothyroidism, unspecified: Secondary | ICD-10-CM | POA: Diagnosis not present

## 2018-04-12 DIAGNOSIS — N179 Acute kidney failure, unspecified: Secondary | ICD-10-CM | POA: Diagnosis not present

## 2018-04-12 DIAGNOSIS — E871 Hypo-osmolality and hyponatremia: Secondary | ICD-10-CM

## 2018-04-12 DIAGNOSIS — R69 Illness, unspecified: Secondary | ICD-10-CM | POA: Diagnosis not present

## 2018-04-12 DIAGNOSIS — K861 Other chronic pancreatitis: Secondary | ICD-10-CM

## 2018-04-13 DIAGNOSIS — R531 Weakness: Secondary | ICD-10-CM | POA: Diagnosis not present

## 2018-04-13 DIAGNOSIS — N184 Chronic kidney disease, stage 4 (severe): Secondary | ICD-10-CM | POA: Diagnosis not present

## 2018-04-13 DIAGNOSIS — E871 Hypo-osmolality and hyponatremia: Secondary | ICD-10-CM | POA: Diagnosis not present

## 2018-04-13 DIAGNOSIS — K861 Other chronic pancreatitis: Secondary | ICD-10-CM | POA: Diagnosis not present

## 2018-04-13 DIAGNOSIS — D649 Anemia, unspecified: Secondary | ICD-10-CM | POA: Diagnosis not present

## 2018-04-13 DIAGNOSIS — R110 Nausea: Secondary | ICD-10-CM | POA: Diagnosis not present

## 2018-04-13 DIAGNOSIS — E860 Dehydration: Secondary | ICD-10-CM | POA: Diagnosis not present

## 2018-04-14 DIAGNOSIS — N184 Chronic kidney disease, stage 4 (severe): Secondary | ICD-10-CM | POA: Diagnosis not present

## 2018-04-14 DIAGNOSIS — R69 Illness, unspecified: Secondary | ICD-10-CM | POA: Diagnosis not present

## 2018-04-14 DIAGNOSIS — K861 Other chronic pancreatitis: Secondary | ICD-10-CM | POA: Diagnosis not present

## 2018-04-14 DIAGNOSIS — R110 Nausea: Secondary | ICD-10-CM | POA: Diagnosis not present

## 2018-04-14 DIAGNOSIS — R197 Diarrhea, unspecified: Secondary | ICD-10-CM | POA: Diagnosis not present

## 2018-04-14 DIAGNOSIS — D649 Anemia, unspecified: Secondary | ICD-10-CM | POA: Diagnosis not present

## 2018-04-14 DIAGNOSIS — E860 Dehydration: Secondary | ICD-10-CM | POA: Diagnosis not present

## 2018-04-14 DIAGNOSIS — N179 Acute kidney failure, unspecified: Secondary | ICD-10-CM | POA: Diagnosis not present

## 2018-04-14 DIAGNOSIS — R531 Weakness: Secondary | ICD-10-CM | POA: Diagnosis not present

## 2018-04-14 DIAGNOSIS — K912 Postsurgical malabsorption, not elsewhere classified: Secondary | ICD-10-CM | POA: Diagnosis not present

## 2018-04-14 DIAGNOSIS — E871 Hypo-osmolality and hyponatremia: Secondary | ICD-10-CM | POA: Diagnosis not present

## 2018-04-14 DIAGNOSIS — E440 Moderate protein-calorie malnutrition: Secondary | ICD-10-CM | POA: Diagnosis not present

## 2018-04-16 DIAGNOSIS — R531 Weakness: Secondary | ICD-10-CM | POA: Diagnosis not present

## 2018-04-16 DIAGNOSIS — D649 Anemia, unspecified: Secondary | ICD-10-CM | POA: Diagnosis not present

## 2018-04-16 DIAGNOSIS — E871 Hypo-osmolality and hyponatremia: Secondary | ICD-10-CM | POA: Diagnosis not present

## 2018-04-16 DIAGNOSIS — E860 Dehydration: Secondary | ICD-10-CM | POA: Diagnosis not present

## 2018-04-16 DIAGNOSIS — N184 Chronic kidney disease, stage 4 (severe): Secondary | ICD-10-CM | POA: Diagnosis not present

## 2018-04-16 DIAGNOSIS — K861 Other chronic pancreatitis: Secondary | ICD-10-CM | POA: Diagnosis not present

## 2018-04-16 DIAGNOSIS — R110 Nausea: Secondary | ICD-10-CM | POA: Diagnosis not present

## 2018-04-17 DIAGNOSIS — R110 Nausea: Secondary | ICD-10-CM | POA: Diagnosis not present

## 2018-04-17 DIAGNOSIS — E860 Dehydration: Secondary | ICD-10-CM | POA: Diagnosis not present

## 2018-04-17 DIAGNOSIS — E871 Hypo-osmolality and hyponatremia: Secondary | ICD-10-CM | POA: Diagnosis not present

## 2018-04-17 DIAGNOSIS — N184 Chronic kidney disease, stage 4 (severe): Secondary | ICD-10-CM | POA: Diagnosis not present

## 2018-04-17 DIAGNOSIS — D649 Anemia, unspecified: Secondary | ICD-10-CM | POA: Diagnosis not present

## 2018-04-17 DIAGNOSIS — K861 Other chronic pancreatitis: Secondary | ICD-10-CM | POA: Diagnosis not present

## 2018-04-17 DIAGNOSIS — R531 Weakness: Secondary | ICD-10-CM | POA: Diagnosis not present

## 2018-04-18 DIAGNOSIS — D649 Anemia, unspecified: Secondary | ICD-10-CM | POA: Diagnosis not present

## 2018-04-18 DIAGNOSIS — R531 Weakness: Secondary | ICD-10-CM | POA: Diagnosis not present

## 2018-04-18 DIAGNOSIS — R69 Illness, unspecified: Secondary | ICD-10-CM | POA: Diagnosis not present

## 2018-04-18 DIAGNOSIS — N184 Chronic kidney disease, stage 4 (severe): Secondary | ICD-10-CM | POA: Diagnosis not present

## 2018-04-18 DIAGNOSIS — E860 Dehydration: Secondary | ICD-10-CM | POA: Diagnosis not present

## 2018-04-18 DIAGNOSIS — K861 Other chronic pancreatitis: Secondary | ICD-10-CM | POA: Diagnosis not present

## 2018-04-18 DIAGNOSIS — R110 Nausea: Secondary | ICD-10-CM | POA: Diagnosis not present

## 2018-04-18 DIAGNOSIS — E871 Hypo-osmolality and hyponatremia: Secondary | ICD-10-CM | POA: Diagnosis not present

## 2018-04-19 DIAGNOSIS — E860 Dehydration: Secondary | ICD-10-CM | POA: Diagnosis not present

## 2018-04-19 DIAGNOSIS — D649 Anemia, unspecified: Secondary | ICD-10-CM | POA: Diagnosis not present

## 2018-04-19 DIAGNOSIS — E876 Hypokalemia: Secondary | ICD-10-CM | POA: Diagnosis not present

## 2018-04-19 DIAGNOSIS — K861 Other chronic pancreatitis: Secondary | ICD-10-CM | POA: Diagnosis not present

## 2018-04-19 DIAGNOSIS — N184 Chronic kidney disease, stage 4 (severe): Secondary | ICD-10-CM | POA: Diagnosis not present

## 2018-04-19 DIAGNOSIS — R69 Illness, unspecified: Secondary | ICD-10-CM | POA: Diagnosis not present

## 2018-04-19 DIAGNOSIS — I129 Hypertensive chronic kidney disease with stage 1 through stage 4 chronic kidney disease, or unspecified chronic kidney disease: Secondary | ICD-10-CM | POA: Diagnosis not present

## 2018-04-19 DIAGNOSIS — E871 Hypo-osmolality and hyponatremia: Secondary | ICD-10-CM | POA: Diagnosis not present

## 2018-04-19 DIAGNOSIS — K912 Postsurgical malabsorption, not elsewhere classified: Secondary | ICD-10-CM | POA: Diagnosis not present

## 2018-04-19 DIAGNOSIS — M1991 Primary osteoarthritis, unspecified site: Secondary | ICD-10-CM | POA: Diagnosis not present

## 2018-04-19 DIAGNOSIS — E039 Hypothyroidism, unspecified: Secondary | ICD-10-CM | POA: Diagnosis not present

## 2018-04-21 DIAGNOSIS — Z79899 Other long term (current) drug therapy: Secondary | ICD-10-CM | POA: Diagnosis not present

## 2018-04-21 DIAGNOSIS — E559 Vitamin D deficiency, unspecified: Secondary | ICD-10-CM | POA: Diagnosis not present

## 2018-04-21 DIAGNOSIS — E038 Other specified hypothyroidism: Secondary | ICD-10-CM | POA: Diagnosis not present

## 2018-04-21 DIAGNOSIS — E119 Type 2 diabetes mellitus without complications: Secondary | ICD-10-CM | POA: Diagnosis not present

## 2018-04-21 DIAGNOSIS — E7849 Other hyperlipidemia: Secondary | ICD-10-CM | POA: Diagnosis not present

## 2018-04-21 DIAGNOSIS — D518 Other vitamin B12 deficiency anemias: Secondary | ICD-10-CM | POA: Diagnosis not present

## 2018-04-26 DIAGNOSIS — R609 Edema, unspecified: Secondary | ICD-10-CM | POA: Diagnosis not present

## 2018-04-26 DIAGNOSIS — I10 Essential (primary) hypertension: Secondary | ICD-10-CM | POA: Diagnosis not present

## 2018-04-26 DIAGNOSIS — K861 Other chronic pancreatitis: Secondary | ICD-10-CM | POA: Diagnosis not present

## 2018-04-27 DIAGNOSIS — E860 Dehydration: Secondary | ICD-10-CM | POA: Diagnosis not present

## 2018-05-04 DIAGNOSIS — I872 Venous insufficiency (chronic) (peripheral): Secondary | ICD-10-CM | POA: Diagnosis not present

## 2018-05-04 DIAGNOSIS — M7989 Other specified soft tissue disorders: Secondary | ICD-10-CM | POA: Diagnosis not present

## 2018-05-04 DIAGNOSIS — E039 Hypothyroidism, unspecified: Secondary | ICD-10-CM | POA: Diagnosis not present

## 2018-05-04 DIAGNOSIS — K591 Functional diarrhea: Secondary | ICD-10-CM | POA: Diagnosis not present

## 2018-05-04 DIAGNOSIS — N183 Chronic kidney disease, stage 3 (moderate): Secondary | ICD-10-CM | POA: Diagnosis not present

## 2018-05-04 DIAGNOSIS — E860 Dehydration: Secondary | ICD-10-CM | POA: Diagnosis not present

## 2018-05-04 DIAGNOSIS — Z6823 Body mass index (BMI) 23.0-23.9, adult: Secondary | ICD-10-CM | POA: Diagnosis not present

## 2018-05-11 DIAGNOSIS — E860 Dehydration: Secondary | ICD-10-CM | POA: Diagnosis not present

## 2018-05-16 DIAGNOSIS — N184 Chronic kidney disease, stage 4 (severe): Secondary | ICD-10-CM | POA: Diagnosis not present

## 2018-05-16 DIAGNOSIS — K912 Postsurgical malabsorption, not elsewhere classified: Secondary | ICD-10-CM | POA: Diagnosis not present

## 2018-05-16 DIAGNOSIS — E871 Hypo-osmolality and hyponatremia: Secondary | ICD-10-CM | POA: Diagnosis not present

## 2018-05-16 DIAGNOSIS — K861 Other chronic pancreatitis: Secondary | ICD-10-CM | POA: Diagnosis not present

## 2018-05-19 DIAGNOSIS — E876 Hypokalemia: Secondary | ICD-10-CM | POA: Diagnosis not present

## 2018-05-19 DIAGNOSIS — E871 Hypo-osmolality and hyponatremia: Secondary | ICD-10-CM | POA: Diagnosis not present

## 2018-05-19 DIAGNOSIS — N184 Chronic kidney disease, stage 4 (severe): Secondary | ICD-10-CM | POA: Diagnosis not present

## 2018-05-19 DIAGNOSIS — M1991 Primary osteoarthritis, unspecified site: Secondary | ICD-10-CM | POA: Diagnosis not present

## 2018-05-19 DIAGNOSIS — E860 Dehydration: Secondary | ICD-10-CM | POA: Diagnosis not present

## 2018-05-19 DIAGNOSIS — E039 Hypothyroidism, unspecified: Secondary | ICD-10-CM | POA: Diagnosis not present

## 2018-05-19 DIAGNOSIS — I129 Hypertensive chronic kidney disease with stage 1 through stage 4 chronic kidney disease, or unspecified chronic kidney disease: Secondary | ICD-10-CM | POA: Diagnosis not present

## 2018-05-19 DIAGNOSIS — D649 Anemia, unspecified: Secondary | ICD-10-CM | POA: Diagnosis not present

## 2018-05-19 DIAGNOSIS — R69 Illness, unspecified: Secondary | ICD-10-CM | POA: Diagnosis not present

## 2018-05-20 DIAGNOSIS — R69 Illness, unspecified: Secondary | ICD-10-CM | POA: Diagnosis not present

## 2018-05-25 DIAGNOSIS — E7849 Other hyperlipidemia: Secondary | ICD-10-CM | POA: Diagnosis not present

## 2018-05-25 DIAGNOSIS — E119 Type 2 diabetes mellitus without complications: Secondary | ICD-10-CM | POA: Diagnosis not present

## 2018-05-25 DIAGNOSIS — D518 Other vitamin B12 deficiency anemias: Secondary | ICD-10-CM | POA: Diagnosis not present

## 2018-05-25 DIAGNOSIS — Z79899 Other long term (current) drug therapy: Secondary | ICD-10-CM | POA: Diagnosis not present

## 2018-05-30 DIAGNOSIS — E860 Dehydration: Secondary | ICD-10-CM | POA: Diagnosis not present

## 2018-05-31 DIAGNOSIS — Z79899 Other long term (current) drug therapy: Secondary | ICD-10-CM | POA: Diagnosis not present

## 2018-05-31 DIAGNOSIS — E7849 Other hyperlipidemia: Secondary | ICD-10-CM | POA: Diagnosis not present

## 2018-05-31 DIAGNOSIS — D518 Other vitamin B12 deficiency anemias: Secondary | ICD-10-CM | POA: Diagnosis not present

## 2018-05-31 DIAGNOSIS — E119 Type 2 diabetes mellitus without complications: Secondary | ICD-10-CM | POA: Diagnosis not present

## 2018-06-01 DIAGNOSIS — Z79899 Other long term (current) drug therapy: Secondary | ICD-10-CM | POA: Diagnosis not present

## 2018-06-01 DIAGNOSIS — D518 Other vitamin B12 deficiency anemias: Secondary | ICD-10-CM | POA: Diagnosis not present

## 2018-06-07 DIAGNOSIS — R69 Illness, unspecified: Secondary | ICD-10-CM | POA: Diagnosis not present

## 2018-06-09 DIAGNOSIS — E7849 Other hyperlipidemia: Secondary | ICD-10-CM | POA: Diagnosis not present

## 2018-06-09 DIAGNOSIS — Z79899 Other long term (current) drug therapy: Secondary | ICD-10-CM | POA: Diagnosis not present

## 2018-06-09 DIAGNOSIS — D518 Other vitamin B12 deficiency anemias: Secondary | ICD-10-CM | POA: Diagnosis not present

## 2018-06-09 DIAGNOSIS — E119 Type 2 diabetes mellitus without complications: Secondary | ICD-10-CM | POA: Diagnosis not present

## 2018-06-15 DIAGNOSIS — D518 Other vitamin B12 deficiency anemias: Secondary | ICD-10-CM | POA: Diagnosis not present

## 2018-06-15 DIAGNOSIS — E119 Type 2 diabetes mellitus without complications: Secondary | ICD-10-CM | POA: Diagnosis not present

## 2018-06-15 DIAGNOSIS — Z79899 Other long term (current) drug therapy: Secondary | ICD-10-CM | POA: Diagnosis not present

## 2018-06-15 DIAGNOSIS — E7849 Other hyperlipidemia: Secondary | ICD-10-CM | POA: Diagnosis not present

## 2018-06-19 DIAGNOSIS — E119 Type 2 diabetes mellitus without complications: Secondary | ICD-10-CM | POA: Diagnosis not present

## 2018-06-19 DIAGNOSIS — D518 Other vitamin B12 deficiency anemias: Secondary | ICD-10-CM | POA: Diagnosis not present

## 2018-06-19 DIAGNOSIS — E038 Other specified hypothyroidism: Secondary | ICD-10-CM | POA: Diagnosis not present

## 2018-06-22 DIAGNOSIS — E559 Vitamin D deficiency, unspecified: Secondary | ICD-10-CM | POA: Diagnosis not present

## 2018-06-22 DIAGNOSIS — E119 Type 2 diabetes mellitus without complications: Secondary | ICD-10-CM | POA: Diagnosis not present

## 2018-06-22 DIAGNOSIS — D518 Other vitamin B12 deficiency anemias: Secondary | ICD-10-CM | POA: Diagnosis not present

## 2018-06-22 DIAGNOSIS — E7849 Other hyperlipidemia: Secondary | ICD-10-CM | POA: Diagnosis not present

## 2018-06-22 DIAGNOSIS — Z79899 Other long term (current) drug therapy: Secondary | ICD-10-CM | POA: Diagnosis not present

## 2018-06-23 DIAGNOSIS — I10 Essential (primary) hypertension: Secondary | ICD-10-CM | POA: Diagnosis not present

## 2018-06-23 DIAGNOSIS — F0390 Unspecified dementia without behavioral disturbance: Secondary | ICD-10-CM | POA: Diagnosis not present

## 2018-06-23 DIAGNOSIS — R69 Illness, unspecified: Secondary | ICD-10-CM | POA: Diagnosis not present

## 2018-06-23 DIAGNOSIS — E871 Hypo-osmolality and hyponatremia: Secondary | ICD-10-CM | POA: Diagnosis not present

## 2018-06-23 DIAGNOSIS — N184 Chronic kidney disease, stage 4 (severe): Secondary | ICD-10-CM | POA: Diagnosis not present

## 2018-06-23 DIAGNOSIS — K219 Gastro-esophageal reflux disease without esophagitis: Secondary | ICD-10-CM | POA: Diagnosis not present

## 2018-06-29 DIAGNOSIS — E119 Type 2 diabetes mellitus without complications: Secondary | ICD-10-CM | POA: Diagnosis not present

## 2018-06-29 DIAGNOSIS — D518 Other vitamin B12 deficiency anemias: Secondary | ICD-10-CM | POA: Diagnosis not present

## 2018-06-29 DIAGNOSIS — E7849 Other hyperlipidemia: Secondary | ICD-10-CM | POA: Diagnosis not present

## 2018-06-29 DIAGNOSIS — Z79899 Other long term (current) drug therapy: Secondary | ICD-10-CM | POA: Diagnosis not present

## 2018-06-30 DIAGNOSIS — R69 Illness, unspecified: Secondary | ICD-10-CM | POA: Diagnosis not present

## 2018-07-03 DIAGNOSIS — E860 Dehydration: Secondary | ICD-10-CM | POA: Diagnosis not present

## 2018-07-03 DIAGNOSIS — N184 Chronic kidney disease, stage 4 (severe): Secondary | ICD-10-CM | POA: Diagnosis not present

## 2018-07-03 DIAGNOSIS — E871 Hypo-osmolality and hyponatremia: Secondary | ICD-10-CM | POA: Diagnosis not present

## 2018-07-06 DIAGNOSIS — D518 Other vitamin B12 deficiency anemias: Secondary | ICD-10-CM | POA: Diagnosis not present

## 2018-07-06 DIAGNOSIS — E119 Type 2 diabetes mellitus without complications: Secondary | ICD-10-CM | POA: Diagnosis not present

## 2018-07-06 DIAGNOSIS — E7849 Other hyperlipidemia: Secondary | ICD-10-CM | POA: Diagnosis not present

## 2018-07-06 DIAGNOSIS — Z79899 Other long term (current) drug therapy: Secondary | ICD-10-CM | POA: Diagnosis not present

## 2018-07-13 DIAGNOSIS — Z79899 Other long term (current) drug therapy: Secondary | ICD-10-CM | POA: Diagnosis not present

## 2018-07-13 DIAGNOSIS — E119 Type 2 diabetes mellitus without complications: Secondary | ICD-10-CM | POA: Diagnosis not present

## 2018-07-13 DIAGNOSIS — E7849 Other hyperlipidemia: Secondary | ICD-10-CM | POA: Diagnosis not present

## 2018-07-13 DIAGNOSIS — D518 Other vitamin B12 deficiency anemias: Secondary | ICD-10-CM | POA: Diagnosis not present

## 2018-07-18 DIAGNOSIS — E038 Other specified hypothyroidism: Secondary | ICD-10-CM | POA: Diagnosis not present

## 2018-07-18 DIAGNOSIS — E119 Type 2 diabetes mellitus without complications: Secondary | ICD-10-CM | POA: Diagnosis not present

## 2018-07-18 DIAGNOSIS — D518 Other vitamin B12 deficiency anemias: Secondary | ICD-10-CM | POA: Diagnosis not present

## 2018-07-18 DIAGNOSIS — R69 Illness, unspecified: Secondary | ICD-10-CM | POA: Diagnosis not present

## 2018-07-21 DIAGNOSIS — K861 Other chronic pancreatitis: Secondary | ICD-10-CM | POA: Diagnosis not present

## 2018-07-21 DIAGNOSIS — F419 Anxiety disorder, unspecified: Secondary | ICD-10-CM | POA: Diagnosis not present

## 2018-07-21 DIAGNOSIS — R69 Illness, unspecified: Secondary | ICD-10-CM | POA: Diagnosis not present

## 2018-07-21 DIAGNOSIS — M79672 Pain in left foot: Secondary | ICD-10-CM | POA: Diagnosis not present

## 2018-07-21 DIAGNOSIS — B351 Tinea unguium: Secondary | ICD-10-CM | POA: Diagnosis not present

## 2018-07-21 DIAGNOSIS — E871 Hypo-osmolality and hyponatremia: Secondary | ICD-10-CM | POA: Diagnosis not present

## 2018-07-21 DIAGNOSIS — N184 Chronic kidney disease, stage 4 (severe): Secondary | ICD-10-CM | POA: Diagnosis not present

## 2018-07-21 DIAGNOSIS — K219 Gastro-esophageal reflux disease without esophagitis: Secondary | ICD-10-CM | POA: Diagnosis not present

## 2018-07-22 DIAGNOSIS — E860 Dehydration: Secondary | ICD-10-CM | POA: Diagnosis not present

## 2018-07-24 DIAGNOSIS — R69 Illness, unspecified: Secondary | ICD-10-CM | POA: Diagnosis not present

## 2018-07-30 DIAGNOSIS — Z933 Colostomy status: Secondary | ICD-10-CM | POA: Diagnosis not present

## 2018-07-30 DIAGNOSIS — R69 Illness, unspecified: Secondary | ICD-10-CM | POA: Diagnosis not present

## 2018-08-04 DIAGNOSIS — E860 Dehydration: Secondary | ICD-10-CM | POA: Diagnosis not present

## 2018-08-05 DIAGNOSIS — E860 Dehydration: Secondary | ICD-10-CM | POA: Diagnosis not present

## 2018-08-11 DIAGNOSIS — N3090 Cystitis, unspecified without hematuria: Secondary | ICD-10-CM | POA: Diagnosis not present

## 2018-08-12 DIAGNOSIS — E038 Other specified hypothyroidism: Secondary | ICD-10-CM | POA: Diagnosis not present

## 2018-08-12 DIAGNOSIS — E119 Type 2 diabetes mellitus without complications: Secondary | ICD-10-CM | POA: Diagnosis not present

## 2018-08-12 DIAGNOSIS — D518 Other vitamin B12 deficiency anemias: Secondary | ICD-10-CM | POA: Diagnosis not present

## 2018-08-12 DIAGNOSIS — R69 Illness, unspecified: Secondary | ICD-10-CM | POA: Diagnosis not present

## 2018-08-18 DIAGNOSIS — E860 Dehydration: Secondary | ICD-10-CM | POA: Diagnosis not present

## 2018-08-22 DIAGNOSIS — E860 Dehydration: Secondary | ICD-10-CM | POA: Diagnosis not present

## 2018-08-25 DIAGNOSIS — R69 Illness, unspecified: Secondary | ICD-10-CM | POA: Diagnosis not present

## 2018-08-25 DIAGNOSIS — E871 Hypo-osmolality and hyponatremia: Secondary | ICD-10-CM | POA: Diagnosis not present

## 2018-08-25 DIAGNOSIS — K861 Other chronic pancreatitis: Secondary | ICD-10-CM | POA: Diagnosis not present

## 2018-08-25 DIAGNOSIS — N184 Chronic kidney disease, stage 4 (severe): Secondary | ICD-10-CM | POA: Diagnosis not present

## 2018-08-25 DIAGNOSIS — K912 Postsurgical malabsorption, not elsewhere classified: Secondary | ICD-10-CM | POA: Diagnosis not present

## 2018-08-26 DIAGNOSIS — R69 Illness, unspecified: Secondary | ICD-10-CM | POA: Diagnosis not present

## 2018-08-31 ENCOUNTER — Telehealth: Payer: Self-pay | Admitting: Neurology

## 2018-08-31 NOTE — Telephone Encounter (Signed)
I called patient regarding rescheduling her 7/16 appointment due to it being an unavailable time on NP Sarah's schedule. Patient's daughter answered and stated that the patient is currently in a nursing facility. I explained to daughter that our office is not permitting patients from assisted living facilities until August to ensure the health of all patients during COVID-19. Patient's daughter verbalized understanding and we rescheduled patient's appt for 11/2018.

## 2018-09-01 DIAGNOSIS — B373 Candidiasis of vulva and vagina: Secondary | ICD-10-CM | POA: Diagnosis not present

## 2018-09-01 DIAGNOSIS — E860 Dehydration: Secondary | ICD-10-CM | POA: Diagnosis not present

## 2018-09-05 DIAGNOSIS — E860 Dehydration: Secondary | ICD-10-CM | POA: Diagnosis not present

## 2018-09-06 DIAGNOSIS — E860 Dehydration: Secondary | ICD-10-CM | POA: Diagnosis not present

## 2018-09-07 DIAGNOSIS — R69 Illness, unspecified: Secondary | ICD-10-CM | POA: Diagnosis not present

## 2018-09-07 DIAGNOSIS — Z79899 Other long term (current) drug therapy: Secondary | ICD-10-CM | POA: Diagnosis not present

## 2018-09-07 DIAGNOSIS — N189 Chronic kidney disease, unspecified: Secondary | ICD-10-CM | POA: Diagnosis not present

## 2018-09-07 DIAGNOSIS — M1990 Unspecified osteoarthritis, unspecified site: Secondary | ICD-10-CM | POA: Diagnosis not present

## 2018-09-07 DIAGNOSIS — N289 Disorder of kidney and ureter, unspecified: Secondary | ICD-10-CM | POA: Diagnosis not present

## 2018-09-07 DIAGNOSIS — E860 Dehydration: Secondary | ICD-10-CM | POA: Diagnosis not present

## 2018-09-08 DIAGNOSIS — N184 Chronic kidney disease, stage 4 (severe): Secondary | ICD-10-CM | POA: Diagnosis not present

## 2018-09-08 DIAGNOSIS — K861 Other chronic pancreatitis: Secondary | ICD-10-CM | POA: Diagnosis not present

## 2018-09-08 DIAGNOSIS — E871 Hypo-osmolality and hyponatremia: Secondary | ICD-10-CM | POA: Diagnosis not present

## 2018-09-08 DIAGNOSIS — R69 Illness, unspecified: Secondary | ICD-10-CM | POA: Diagnosis not present

## 2018-09-08 DIAGNOSIS — Z79899 Other long term (current) drug therapy: Secondary | ICD-10-CM | POA: Diagnosis not present

## 2018-09-08 DIAGNOSIS — N189 Chronic kidney disease, unspecified: Secondary | ICD-10-CM | POA: Diagnosis not present

## 2018-09-08 DIAGNOSIS — R0902 Hypoxemia: Secondary | ICD-10-CM | POA: Diagnosis not present

## 2018-09-08 DIAGNOSIS — M2550 Pain in unspecified joint: Secondary | ICD-10-CM | POA: Diagnosis not present

## 2018-09-08 DIAGNOSIS — Z7401 Bed confinement status: Secondary | ICD-10-CM | POA: Diagnosis not present

## 2018-09-08 DIAGNOSIS — E860 Dehydration: Secondary | ICD-10-CM | POA: Diagnosis not present

## 2018-09-08 DIAGNOSIS — R531 Weakness: Secondary | ICD-10-CM | POA: Diagnosis not present

## 2018-09-08 DIAGNOSIS — M1990 Unspecified osteoarthritis, unspecified site: Secondary | ICD-10-CM | POA: Diagnosis not present

## 2018-09-15 ENCOUNTER — Ambulatory Visit: Payer: Medicare HMO | Admitting: Neurology

## 2018-09-15 DIAGNOSIS — E860 Dehydration: Secondary | ICD-10-CM | POA: Diagnosis not present

## 2018-09-18 DIAGNOSIS — R531 Weakness: Secondary | ICD-10-CM | POA: Diagnosis not present

## 2018-09-19 DIAGNOSIS — R69 Illness, unspecified: Secondary | ICD-10-CM | POA: Diagnosis not present

## 2018-09-21 DIAGNOSIS — E860 Dehydration: Secondary | ICD-10-CM | POA: Diagnosis not present

## 2018-09-21 DIAGNOSIS — R69 Illness, unspecified: Secondary | ICD-10-CM | POA: Diagnosis not present

## 2018-09-22 DIAGNOSIS — N183 Chronic kidney disease, stage 3 (moderate): Secondary | ICD-10-CM | POA: Diagnosis not present

## 2018-09-22 DIAGNOSIS — Z6823 Body mass index (BMI) 23.0-23.9, adult: Secondary | ICD-10-CM | POA: Diagnosis not present

## 2018-09-30 DIAGNOSIS — D631 Anemia in chronic kidney disease: Secondary | ICD-10-CM | POA: Diagnosis not present

## 2018-09-30 DIAGNOSIS — D518 Other vitamin B12 deficiency anemias: Secondary | ICD-10-CM | POA: Diagnosis not present

## 2018-10-04 DIAGNOSIS — K912 Postsurgical malabsorption, not elsewhere classified: Secondary | ICD-10-CM | POA: Diagnosis not present

## 2018-10-04 DIAGNOSIS — K219 Gastro-esophageal reflux disease without esophagitis: Secondary | ICD-10-CM | POA: Diagnosis not present

## 2018-10-04 DIAGNOSIS — N184 Chronic kidney disease, stage 4 (severe): Secondary | ICD-10-CM | POA: Diagnosis not present

## 2018-10-11 ENCOUNTER — Other Ambulatory Visit: Payer: Self-pay | Admitting: Neurology

## 2018-10-11 MED ORDER — MEMANTINE HCL 10 MG PO TABS: 10.0000 mg | ORAL_TABLET | Freq: Two times a day (BID) | ORAL | 1 refills | Status: AC

## 2018-11-21 ENCOUNTER — Ambulatory Visit: Payer: Medicare HMO | Admitting: Neurology

## 2018-11-24 DIAGNOSIS — N390 Urinary tract infection, site not specified: Secondary | ICD-10-CM | POA: Diagnosis not present

## 2018-11-26 DIAGNOSIS — R0902 Hypoxemia: Secondary | ICD-10-CM | POA: Diagnosis not present

## 2018-11-26 DIAGNOSIS — Z7401 Bed confinement status: Secondary | ICD-10-CM | POA: Diagnosis not present

## 2018-11-26 DIAGNOSIS — R404 Transient alteration of awareness: Secondary | ICD-10-CM | POA: Diagnosis not present

## 2019-01-01 DEATH — deceased

## 2020-01-01 DEATH — deceased

## 2020-12-31 DEATH — deceased
